# Patient Record
Sex: Female | Born: 1980 | Race: White | Hispanic: No | Marital: Single | Smoking: Current every day smoker
Health system: Southern US, Community
[De-identification: ages and names within clinical notes are randomized; demographics above are authoritative.]

## PROBLEM LIST (undated history)

## (undated) DIAGNOSIS — R51 Headache: Principal | ICD-10-CM

## (undated) DIAGNOSIS — R569 Unspecified convulsions: Secondary | ICD-10-CM

## (undated) DIAGNOSIS — R519 Headache, unspecified: Secondary | ICD-10-CM

## (undated) HISTORY — DX: Unspecified convulsions: R56.9

## (undated) HISTORY — PX: GALLBLADDER SURGERY: SHX652

## (undated) HISTORY — PX: APPENDECTOMY: SHX54

## (undated) HISTORY — DX: Headache: R51

## (undated) HISTORY — PX: CYST EXCISION: SHX5701

## (undated) HISTORY — PX: PARTIAL HYSTERECTOMY: SHX80

## (undated) HISTORY — DX: Headache, unspecified: R51.9

---

## 2004-05-13 ENCOUNTER — Other Ambulatory Visit: Payer: Self-pay

## 2004-08-27 ENCOUNTER — Emergency Department: Payer: Self-pay | Admitting: Emergency Medicine

## 2004-09-19 ENCOUNTER — Emergency Department: Payer: Self-pay | Admitting: Internal Medicine

## 2004-09-30 ENCOUNTER — Emergency Department: Payer: Self-pay | Admitting: Emergency Medicine

## 2004-10-03 ENCOUNTER — Ambulatory Visit: Payer: Self-pay | Admitting: Obstetrics & Gynecology

## 2004-11-16 ENCOUNTER — Emergency Department: Payer: Self-pay | Admitting: Emergency Medicine

## 2005-04-19 ENCOUNTER — Emergency Department: Payer: Self-pay | Admitting: Emergency Medicine

## 2005-05-13 ENCOUNTER — Ambulatory Visit: Payer: Self-pay

## 2005-06-08 ENCOUNTER — Emergency Department: Payer: Self-pay | Admitting: Emergency Medicine

## 2005-10-10 ENCOUNTER — Emergency Department: Payer: Self-pay | Admitting: Emergency Medicine

## 2006-08-22 ENCOUNTER — Emergency Department: Payer: Self-pay | Admitting: Emergency Medicine

## 2006-09-02 ENCOUNTER — Emergency Department: Payer: Self-pay

## 2006-09-28 ENCOUNTER — Emergency Department: Payer: Self-pay | Admitting: Emergency Medicine

## 2006-10-15 ENCOUNTER — Emergency Department: Payer: Self-pay | Admitting: Emergency Medicine

## 2006-11-04 ENCOUNTER — Ambulatory Visit: Payer: Self-pay | Admitting: Gastroenterology

## 2007-04-24 ENCOUNTER — Emergency Department: Payer: Self-pay | Admitting: Emergency Medicine

## 2007-04-26 ENCOUNTER — Emergency Department: Payer: Self-pay | Admitting: Emergency Medicine

## 2007-04-28 ENCOUNTER — Emergency Department (HOSPITAL_COMMUNITY): Admission: EM | Admit: 2007-04-28 | Discharge: 2007-04-28 | Payer: Self-pay | Admitting: Emergency Medicine

## 2007-05-09 ENCOUNTER — Emergency Department: Payer: Self-pay

## 2007-05-31 ENCOUNTER — Emergency Department (HOSPITAL_COMMUNITY): Admission: EM | Admit: 2007-05-31 | Discharge: 2007-05-31 | Payer: Self-pay | Admitting: Emergency Medicine

## 2007-07-19 ENCOUNTER — Emergency Department: Payer: Self-pay | Admitting: Emergency Medicine

## 2007-08-08 ENCOUNTER — Emergency Department: Payer: Self-pay | Admitting: Internal Medicine

## 2007-08-27 ENCOUNTER — Other Ambulatory Visit: Payer: Self-pay

## 2007-08-27 ENCOUNTER — Emergency Department: Payer: Self-pay | Admitting: Emergency Medicine

## 2007-09-26 ENCOUNTER — Emergency Department: Payer: Self-pay | Admitting: Internal Medicine

## 2007-09-29 ENCOUNTER — Inpatient Hospital Stay: Payer: Self-pay | Admitting: Internal Medicine

## 2007-10-22 ENCOUNTER — Emergency Department: Payer: Self-pay | Admitting: Emergency Medicine

## 2007-10-22 ENCOUNTER — Other Ambulatory Visit: Payer: Self-pay

## 2007-10-31 ENCOUNTER — Inpatient Hospital Stay: Payer: Self-pay | Admitting: Internal Medicine

## 2007-11-23 ENCOUNTER — Ambulatory Visit: Payer: Self-pay | Admitting: Ophthalmology

## 2007-11-29 ENCOUNTER — Emergency Department: Payer: Self-pay | Admitting: Emergency Medicine

## 2007-11-30 ENCOUNTER — Emergency Department: Payer: Self-pay | Admitting: Emergency Medicine

## 2007-12-17 ENCOUNTER — Ambulatory Visit: Payer: Self-pay | Admitting: Obstetrics and Gynecology

## 2007-12-23 ENCOUNTER — Emergency Department: Payer: Self-pay | Admitting: Emergency Medicine

## 2007-12-24 ENCOUNTER — Inpatient Hospital Stay: Payer: Self-pay | Admitting: Internal Medicine

## 2008-02-18 ENCOUNTER — Emergency Department: Payer: Self-pay | Admitting: Emergency Medicine

## 2008-02-29 ENCOUNTER — Emergency Department: Payer: Self-pay | Admitting: Emergency Medicine

## 2008-04-12 ENCOUNTER — Ambulatory Visit: Payer: Self-pay | Admitting: Family Medicine

## 2008-04-12 ENCOUNTER — Emergency Department: Payer: Self-pay | Admitting: Emergency Medicine

## 2008-04-12 ENCOUNTER — Encounter: Payer: Self-pay | Admitting: Family Medicine

## 2008-04-13 ENCOUNTER — Emergency Department: Payer: Self-pay | Admitting: Emergency Medicine

## 2008-04-17 ENCOUNTER — Ambulatory Visit: Payer: Self-pay | Admitting: Family Medicine

## 2008-04-20 ENCOUNTER — Emergency Department: Payer: Self-pay | Admitting: Emergency Medicine

## 2008-04-22 ENCOUNTER — Emergency Department: Payer: Self-pay | Admitting: Emergency Medicine

## 2008-06-17 ENCOUNTER — Emergency Department: Payer: Self-pay | Admitting: Emergency Medicine

## 2008-06-29 ENCOUNTER — Inpatient Hospital Stay: Payer: Self-pay | Admitting: Internal Medicine

## 2008-08-09 ENCOUNTER — Observation Stay: Payer: Self-pay | Admitting: Internal Medicine

## 2009-01-12 ENCOUNTER — Emergency Department: Payer: Self-pay | Admitting: Emergency Medicine

## 2009-02-18 ENCOUNTER — Emergency Department: Payer: Self-pay | Admitting: Emergency Medicine

## 2009-02-19 ENCOUNTER — Emergency Department: Payer: Self-pay | Admitting: Emergency Medicine

## 2009-02-25 ENCOUNTER — Emergency Department: Payer: Self-pay | Admitting: Unknown Physician Specialty

## 2009-03-28 ENCOUNTER — Emergency Department: Payer: Self-pay | Admitting: Emergency Medicine

## 2009-04-29 ENCOUNTER — Emergency Department: Payer: Self-pay | Admitting: Unknown Physician Specialty

## 2009-04-30 ENCOUNTER — Emergency Department: Payer: Self-pay | Admitting: Emergency Medicine

## 2009-06-30 ENCOUNTER — Emergency Department: Payer: Self-pay | Admitting: Internal Medicine

## 2009-07-09 ENCOUNTER — Emergency Department: Payer: Self-pay | Admitting: Emergency Medicine

## 2009-11-16 ENCOUNTER — Emergency Department: Payer: Self-pay | Admitting: Unknown Physician Specialty

## 2010-03-02 ENCOUNTER — Emergency Department: Payer: Self-pay | Admitting: Unknown Physician Specialty

## 2010-06-26 ENCOUNTER — Emergency Department: Payer: Self-pay | Admitting: Emergency Medicine

## 2010-08-05 ENCOUNTER — Emergency Department: Payer: Self-pay | Admitting: Emergency Medicine

## 2010-08-24 ENCOUNTER — Emergency Department: Payer: Self-pay | Admitting: Emergency Medicine

## 2010-08-30 ENCOUNTER — Emergency Department: Payer: Self-pay | Admitting: Emergency Medicine

## 2010-09-10 ENCOUNTER — Emergency Department: Payer: Self-pay | Admitting: Emergency Medicine

## 2011-04-01 NOTE — Assessment & Plan Note (Signed)
Lindsay Ho, CASEBEER NO.:  [id number]   MEDICAL RECORD NO.:  [id number]          PATIENT TYPE:  POB   LOCATION:  [facility] at [facility]         FACILITY:  [facility]   PHYSICIAN:  Tinnie Gens, MD        DATE OF BIRTH:  1981/08/06   DATE OF SERVICE:  04/17/2008                                  CLINIC NOTE   CHIEF COMPLAINT:  Follow up results.   HISTORY OF PRESENT ILLNESS:  The patient is a 30 year old gravida 5,  para 2, who was just seen on Apr 12, 2008, for chronic pelvic pain, who  at that time, told us that she had previously been seen at [facility], and that she had been taking Tylenol and Vicodin for 1-  year history of right-sided pelvic pain and bloating.  The patient did  ask for pain medicine as she was leaving the door and does have history  to ibuprofen.  The patient initially left with a plan for pelvic  sonography and then followup in 2 weeks.  After seeing [place], her  ultrasound was scheduled for Apr 14, 2008, but she failed to keep that  appointment.  The patient then went to [facility] ED on Apr 12, 2008, after leaving here and stated that her Vicodin was not working.  She says that she did not stay in that ED because of a 10-hour wait.  The patient then goes to [facility] yesterday and has pelvic  sonogram, which basically shows a normal pelvis including a 2-cm  probably follicle on the right ovary.  At that time, she was complaining  of left lower quadrant pain.  She tells me that she has an OB physician  at an outside clinic, but that she could not get scheduled for an  ultrasound for several weeks and that was the last she came to their  establishment.  When I confronted her on this issue, she states that she  told them it will be several weeks before I could get her back for  results, which she was scheduled back in 2 weeks for results, but she  did not have an ultrasound scheduled for the week prior for actually 2  days after her visit with [place].  The patient was given IV Dilaudid when  she was in the ED there, as well as some p.o. Phenergan and was sent  home.  The patient was confronted about drug-seeking behavior, which she  completely denied and stated that she did not want Vicodin, she did not  want anything for pain.  She just used Tylenol, would it help [place] if she  brought back in her Vicodin.  I had told her yes, she states she has  only taken 4 out of the pack, and then she will bring them in tomorrow.  Additionally, we called the [facility] to get her records  from there.  It turns out she has been on quite a lot of narcotics  including a fentanyl patch 35 mcg every third day, Xanax, Percocet,  Ambien, Lunesta, Tussionex, and so it is unclear to me why she has so  much chronic pain and why she is on so many drugs, but I did offer her  surgery to include an endometrial ablation.  I did tell her that this  would mean absolutely no more kids even though she is status post tubal.  She immediately stated she was not clear if she wanted the surgery done.  Additionally, I offered her a laparoscopy to see if she had some  possible scar tissue that was leading to her chronic pelvic pain.  Did  discuss risks of this and this would be her fifth laparoscopy, and the  potential for injury to bowel, and the patient quickly said she was  unsure if she wanted this done either.  Since leaving the office today,  we have several pharmacies who faxed in prescriptions for Xanax,  Vicodin, Ambien, and Percocet in the past several months.  Different  physicians have written her for different ones of these, and she has  gone to at least 2 different drug systems about the fentanyl patch, and  I think she has been getting or not almost __________  she has several  other drug sources that she is using.  The patient seemed very well  equipped with knowledge about drug-seeking behavior and the problems  with  narcotics in this day and age including the fact that her previous  physician had may be even suspended for trying too much in the way of  narcotics for patients.  So, she is supposed to bring back her Vicodin  tomorrow and call us back when she is ready to talk about scheduling  surgery.  I do not think it would be appropriate to give her any more  narcotics at this time, given how much she has been on previously,  although she started to __________ ignorance when I  discussed as this  might be an issue for her.           ______________________________  Tinnie Gens, MD     TP/MEDQ  D:  04/17/2008  T:  04/18/2008  Job:  [id number]

## 2011-04-01 NOTE — Assessment & Plan Note (Signed)
NAMEKIRBIE, STODGHILL NO.:  [id number]   MEDICAL RECORD NO.:  [id number]          PATIENT TYPE:  POB   LOCATION:  [facility] at [facility]         FACILITY:  [facility]   PHYSICIAN:  Tinnie Gens, MD        DATE OF BIRTH:  Sep 07, 1981   DATE OF SERVICE:  04/12/2008                                  CLINIC NOTE   CHIEF COMPLAINT:  Pelvic pain.   HISTORY OF PRESENT ILLNESS:  The patient is a 30 year old gravida 5,  para 2, who has a history of right ovarian cyst with ovarian cystectomy  at [facility] as well as a history of tubal ligation in  the past in 2008.  She has reported pelvic pain for some time as well as  depression.  She is on Cymbalta and Seroquel for that.  She reports 1-  year history of pelvic pain especially on the right side associated with  some bloating of the abdomen.  She reports the pain radiates into her  legs and is sharp in nature.  She takes Tylenol and Vicodin for pain,  which usually relieves it.  She travels a lot.  Her husband is in [place], and so, she has gotten care in multiple places, but they are  back here to stay.  She reports she has not seen a doctor for  approximately 3 months, but most recently saw [facility],  who did do a TSH approximately 2-3 months ago.  The patient also reports  cycles that are every 60-90 days and have a lot of heavy flow related to  them.  The patient previously had an IUD.  She thought that her  abdominal pain got worse with it, so she had it removed and then had  tubal ligation.  However, she reports that her pelvic pain is actually  worse since her tubal ligation.  She has not had a pelvic sonogram  recently.   PAST MEDICAL HISTORY:  Negative.   PAST SURGICAL HISTORY:  1. Cholecystectomy in 2007.  2. Appendectomy in 1999.  3. Tubal ligation in 2008.  4. Ovarian cyst removal at [facility] in 2005, that was on the      right side.   MEDICATIONS:  1. Cymbalta 1  p.o. daily.  2. Seroquel 1 p.o. nightly.  3. Ambien 10 mg 1 p.o. nightly.   ALLERGIES:  IBUPROFEN causes itching and throwing up.  She reports that  she never takes aspirin, and that she can take Aleve but it does not  really work for her.   OB HISTORY:  She is G5, P2 with 2 vaginal deliveries.  She had 2  miscarriages and 1 termination.  Her children are aged 34 and 41, both  girls.   GYN HISTORY:  Menarche at age 71.  Cycles every 30-60 days.  Heavy flow.  Severe pain.  They last for approximately 7 days.  She has had tubal  ligation for birth control and no history of abnormal Pap.  No history  of STDs.   FAMILY HISTORY:  Significant for coronary artery disease, hypertension,  and cancer.   SOCIAL HISTORY:  She is a smoker, one-half pack per day for  approximately 10 years.  No other drugs or alcohol use.  She denies  sexual abuse.   REVIEW OF SYSTEMS:  A 14-point review of systems is reviewed.  Please  see GYN history on the chart.  She reports some fatigue all the time.  She thinks this is related to some mild anemia.  She has some dizzy  spells that are also associated with this.  She reports nausea  associated with abdominal pain and anorexia.  At that time, she does not  feel like eating, but no exceptional vomiting.  She reports hot flashes  as well.   PHYSICAL EXAMINATION:  Her vitals are as noted in the chart.  She is a  well-developed, well-nourished female in no acute distress.  ABDOMEN:  Soft, nontender, nondistended.  GU:  Normal external female genitalia.  BUS is normal.  Vagina is pink  and rugated.  Cervix is parous without lesion.  The uterus is small,  anteverted, exquisitely tender.  No left adnexal tenderness.  In the  right adnexa, there is some fullness on that side, question separate  from the ovary and it is exquisitely tender as well.   IMPRESSION:  1. Pelvic pain.  2. Gynecologic exam with Pap.   PLAN:  1. Pap smear today.  2. Pelvic  sonogram.  3. Vicodin for pain.  The patient asked for this on leaving the room.  4. Obtain records from the [facility].  5. Discussed with the patient what her options are except for maybe      endometrial ablation plus or minus right ovary removal.  She kind      of would like both ovaries removed.  However, we discussed that      this is not an option as she will become menopausal.  Results will      be reviewed in 2 weeks.           ______________________________  Tinnie Gens, MD     TP/MEDQ  D:  04/12/2008  T:  04/13/2008  Job:  [id number]

## 2011-04-21 ENCOUNTER — Emergency Department: Payer: Self-pay | Admitting: Emergency Medicine

## 2011-04-25 ENCOUNTER — Emergency Department: Payer: Self-pay | Admitting: Emergency Medicine

## 2011-07-08 ENCOUNTER — Inpatient Hospital Stay: Payer: Self-pay | Admitting: Internal Medicine

## 2011-09-02 LAB — DIFFERENTIAL
Basophils Absolute: 0.1
Basophils Relative: 1
Eosinophils Absolute: 0
Eosinophils Relative: 0
Lymphocytes Relative: 17
Lymphs Abs: 2
Monocytes Absolute: 0.8 — ABNORMAL HIGH
Monocytes Relative: 7
Neutro Abs: 8.9 — ABNORMAL HIGH
Neutrophils Relative %: 75

## 2011-09-02 LAB — CBC
HCT: 32.4 — ABNORMAL LOW
Hemoglobin: 10.7 — ABNORMAL LOW
MCHC: 32.9
MCV: 73.7 — ABNORMAL LOW
Platelets: 324
RBC: 4.4
RDW: 16.8 — ABNORMAL HIGH
WBC: 11.8 — ABNORMAL HIGH

## 2011-09-02 LAB — URINALYSIS, ROUTINE W REFLEX MICROSCOPIC
Protein, ur: NEGATIVE
Specific Gravity, Urine: 1.016
Urobilinogen, UA: 0.2

## 2011-09-02 LAB — I-STAT 8, (EC8 V) (CONVERTED LAB)
Acid-base deficit: 1
Chloride: 108
HCT: 35 — ABNORMAL LOW
Hemoglobin: 11.9 — ABNORMAL LOW
Operator id: 151321
Potassium: 4.1
Sodium: 138
pCO2, Ven: 30.8 — ABNORMAL LOW

## 2011-09-02 LAB — POCT PREGNANCY, URINE: Operator id: 151321

## 2011-09-02 LAB — WET PREP, GENITAL: Clue Cells Wet Prep HPF POC: NONE SEEN

## 2011-09-02 LAB — RPR: RPR Ser Ql: NONREACTIVE

## 2011-09-02 LAB — URINE MICROSCOPIC-ADD ON

## 2011-09-02 LAB — URINE CULTURE: Colony Count: NO GROWTH

## 2011-09-02 LAB — POCT I-STAT CREATININE: Operator id: 151321

## 2011-10-17 ENCOUNTER — Emergency Department: Payer: Self-pay | Admitting: *Deleted

## 2012-02-02 ENCOUNTER — Emergency Department: Payer: Self-pay | Admitting: Emergency Medicine

## 2012-03-18 LAB — TROPONIN I: Troponin-I: <0.02 ng/mL

## 2012-03-18 LAB — COMPREHENSIVE METABOLIC PANEL
Albumin: 4.3 g/dL (ref 3.4–5.0)
Alkaline Phosphatase: 63 U/L (ref 50–136)
Bilirubin,Total: 0.4 mg/dL (ref 0.2–1.0)
Chloride: 107 mmol/L (ref 98–107)
Co2: 28 mmol/L (ref 21–32)
EGFR (Non-African Amer.): >60
Glucose: 68 mg/dL (ref 65–99)
Osmolality: 277 (ref 275–301)
Potassium: 3.6 mmol/L (ref 3.5–5.1)
SGPT (ALT): 13 U/L
Sodium: 140 mmol/L (ref 136–145)

## 2012-03-18 LAB — CBC
HCT: 42 % (ref 35.0–47.0)
MCH: 29.2 pg (ref 26.0–34.0)
MCHC: 34.3 g/dL (ref 32.0–36.0)
Platelet: 242 10*3/uL (ref 150–440)
RBC: 4.94 10*6/uL (ref 3.80–5.20)

## 2012-03-19 ENCOUNTER — Inpatient Hospital Stay: Payer: Self-pay | Admitting: Internal Medicine

## 2012-03-20 LAB — BASIC METABOLIC PANEL
Anion Gap: 8 (ref 7–16)
BUN: 16 mg/dL (ref 7–18)
Chloride: 105 mmol/L (ref 98–107)
Co2: 23 mmol/L (ref 21–32)
Creatinine: 0.58 mg/dL — ABNORMAL LOW (ref 0.60–1.30)
EGFR (African American): >60
EGFR (Non-African Amer.): >60
Osmolality: 273 (ref 275–301)
Potassium: 3.8 mmol/L (ref 3.5–5.1)
Sodium: 136 mmol/L (ref 136–145)

## 2012-03-20 LAB — CBC WITH DIFFERENTIAL/PLATELET
Basophil #: 0.1 10*3/uL (ref 0.0–0.1)
Basophil %: 0.8 %
Eosinophil #: 0.3 10*3/uL (ref 0.0–0.7)
Lymphocyte %: 33 %
MCV: 86 fL (ref 80–100)
Monocyte #: 0.9 x10 3/mm (ref 0.2–0.9)
Monocyte %: 7.6 %
Neutrophil #: 6.3 10*3/uL (ref 1.4–6.5)
Platelet: 222 10*3/uL (ref 150–440)
RDW: 14.1 % (ref 11.5–14.5)

## 2012-08-11 ENCOUNTER — Ambulatory Visit: Payer: Self-pay | Admitting: Obstetrics & Gynecology

## 2012-08-13 ENCOUNTER — Ambulatory Visit: Payer: Self-pay | Admitting: Obstetrics & Gynecology

## 2012-12-18 ENCOUNTER — Emergency Department: Payer: Self-pay | Admitting: Emergency Medicine

## 2012-12-18 LAB — COMPREHENSIVE METABOLIC PANEL
Albumin: 4.5 g/dL (ref 3.4–5.0)
Alkaline Phosphatase: 82 U/L (ref 50–136)
BUN: 14 mg/dL (ref 7–18)
Bilirubin,Total: 0.3 mg/dL (ref 0.2–1.0)
Chloride: 105 mmol/L (ref 98–107)
Co2: 28 mmol/L (ref 21–32)
Creatinine: 0.76 mg/dL (ref 0.60–1.30)
EGFR (African American): >60
Glucose: 109 mg/dL — ABNORMAL HIGH (ref 65–99)
Potassium: 3.8 mmol/L (ref 3.5–5.1)
SGOT(AST): 24 U/L (ref 15–37)
SGPT (ALT): 29 U/L (ref 12–78)
Sodium: 138 mmol/L (ref 136–145)
Total Protein: 8.1 g/dL (ref 6.4–8.2)

## 2012-12-18 LAB — CBC
HCT: 41.2 % (ref 35.0–47.0)
HGB: 13.7 g/dL (ref 12.0–16.0)
MCH: 28.9 pg (ref 26.0–34.0)
MCHC: 33.4 g/dL (ref 32.0–36.0)
MCV: 87 fL (ref 80–100)
Platelet: 242 10*3/uL (ref 150–440)
RBC: 4.76 10*6/uL (ref 3.80–5.20)
RDW: 13 % (ref 11.5–14.5)
WBC: 14 10*3/uL — ABNORMAL HIGH (ref 3.6–11.0)

## 2012-12-18 LAB — TROPONIN I: Troponin-I: <0.02 ng/mL

## 2013-03-14 ENCOUNTER — Emergency Department: Payer: Self-pay | Admitting: Emergency Medicine

## 2013-03-14 LAB — CBC
HCT: 40.2 % (ref 35.0–47.0)
HGB: 13.7 g/dL (ref 12.0–16.0)
MCH: 28.7 pg (ref 26.0–34.0)
MCHC: 34.1 g/dL (ref 32.0–36.0)
RBC: 4.77 10*6/uL (ref 3.80–5.20)
WBC: 15 10*3/uL — ABNORMAL HIGH (ref 3.6–11.0)

## 2013-03-14 LAB — LIPASE, BLOOD: Lipase: 265 U/L (ref 73–393)

## 2013-03-14 LAB — URINALYSIS, COMPLETE
Bilirubin,UR: NEGATIVE
Blood: NEGATIVE
Glucose,UR: NEGATIVE mg/dL (ref 0–75)
Ketone: NEGATIVE
Nitrite: NEGATIVE
Ph: 6 (ref 4.5–8.0)
Protein: NEGATIVE
RBC,UR: 1 /HPF (ref 0–5)
Squamous Epithelial: 14
WBC UR: 2 /HPF (ref 0–5)

## 2013-03-14 LAB — COMPREHENSIVE METABOLIC PANEL
Alkaline Phosphatase: 52 U/L (ref 50–136)
BUN: 21 mg/dL — ABNORMAL HIGH (ref 7–18)
Bilirubin,Total: 0.2 mg/dL (ref 0.2–1.0)
Calcium, Total: 8.4 mg/dL — ABNORMAL LOW (ref 8.5–10.1)
Co2: 25 mmol/L (ref 21–32)
Creatinine: 0.64 mg/dL (ref 0.60–1.30)
Potassium: 3.5 mmol/L (ref 3.5–5.1)
SGOT(AST): 13 U/L — ABNORMAL LOW (ref 15–37)
SGPT (ALT): 11 U/L — ABNORMAL LOW (ref 12–78)
Total Protein: 7.5 g/dL (ref 6.4–8.2)

## 2013-03-14 LAB — PROTIME-INR: INR: 0.9

## 2013-07-04 ENCOUNTER — Emergency Department: Payer: Self-pay | Admitting: Emergency Medicine

## 2013-07-25 ENCOUNTER — Inpatient Hospital Stay: Payer: Self-pay | Admitting: Internal Medicine

## 2013-07-25 LAB — URINALYSIS, COMPLETE
Glucose,UR: NEGATIVE mg/dL (ref 0–75)
Ph: 6 (ref 4.5–8.0)
RBC,UR: <1 /HPF (ref 0–5)
Specific Gravity: 1.014 (ref 1.003–1.030)
Squamous Epithelial: 8

## 2013-07-25 LAB — CBC WITH DIFFERENTIAL/PLATELET
Basophil #: 0 10*3/uL (ref 0.0–0.1)
Basophil %: 0.7 %
HCT: 16 % — ABNORMAL LOW (ref 35.0–47.0)
Lymphocyte #: 2.1 10*3/uL (ref 1.0–3.6)
Monocyte #: 0.6 x10 3/mm (ref 0.2–0.9)
Neutrophil #: 4.4 10*3/uL (ref 1.4–6.5)
Platelet: 388 10*3/uL (ref 150–440)
RBC: 2.04 10*6/uL — ABNORMAL LOW (ref 3.80–5.20)
RDW: 18.1 % — ABNORMAL HIGH (ref 11.5–14.5)
WBC: 7.4 10*3/uL (ref 3.6–11.0)

## 2013-07-25 LAB — APTT: Activated PTT: 30.3 secs (ref 23.6–35.9)

## 2013-07-25 LAB — COMPREHENSIVE METABOLIC PANEL
Albumin: 3.5 g/dL (ref 3.4–5.0)
Alkaline Phosphatase: 59 U/L (ref 50–136)
Anion Gap: 4 — ABNORMAL LOW (ref 7–16)
BUN: 11 mg/dL (ref 7–18)
Bilirubin,Total: 0.2 mg/dL (ref 0.2–1.0)
Co2: 27 mmol/L (ref 21–32)
Creatinine: 0.62 mg/dL (ref 0.60–1.30)
Glucose: 89 mg/dL (ref 65–99)
Osmolality: 273 (ref 275–301)
SGOT(AST): 11 U/L — ABNORMAL LOW (ref 15–37)
SGPT (ALT): 14 U/L (ref 12–78)

## 2013-07-25 LAB — IRON AND TIBC
Iron Saturation: 3 %
Iron: 14 ug/dL — ABNORMAL LOW (ref 50–170)

## 2013-07-25 LAB — PROTIME-INR: INR: 1

## 2013-07-26 LAB — BASIC METABOLIC PANEL
Calcium, Total: 7.4 mg/dL — ABNORMAL LOW (ref 8.5–10.1)
Co2: 24 mmol/L (ref 21–32)
EGFR (Non-African Amer.): >60
Glucose: 80 mg/dL (ref 65–99)
Osmolality: 277 (ref 275–301)
Potassium: 3.9 mmol/L (ref 3.5–5.1)
Sodium: 141 mmol/L (ref 136–145)

## 2013-07-26 LAB — CBC WITH DIFFERENTIAL/PLATELET
Basophil #: 0.1 10*3/uL (ref 0.0–0.1)
Eosinophil #: 0.2 10*3/uL (ref 0.0–0.7)
HCT: 23.8 % — ABNORMAL LOW (ref 35.0–47.0)
HGB: 8.1 g/dL — ABNORMAL LOW (ref 12.0–16.0)
Lymphocyte #: 2.8 10*3/uL (ref 1.0–3.6)
Lymphocyte %: 37.3 %
MCV: 79 fL — ABNORMAL LOW (ref 80–100)
Monocyte #: 0.4 x10 3/mm (ref 0.2–0.9)
Neutrophil #: 4 10*3/uL (ref 1.4–6.5)
Neutrophil %: 53.9 %
Platelet: 316 10*3/uL (ref 150–440)
RBC: 2.99 10*6/uL — ABNORMAL LOW (ref 3.80–5.20)
RDW: 16.2 % — ABNORMAL HIGH (ref 11.5–14.5)

## 2013-07-27 LAB — PATHOLOGY REPORT

## 2013-07-27 LAB — HEMOGLOBIN: HGB: 8.6 g/dL — ABNORMAL LOW (ref 12.0–16.0)

## 2013-07-28 LAB — HEMOGLOBIN: HGB: 8.5 g/dL — ABNORMAL LOW (ref 12.0–16.0)

## 2013-07-29 LAB — BASIC METABOLIC PANEL
Anion Gap: 4 — ABNORMAL LOW (ref 7–16)
BUN: 11 mg/dL (ref 7–18)
Calcium, Total: 8.7 mg/dL (ref 8.5–10.1)
Chloride: 105 mmol/L (ref 98–107)
Creatinine: 0.59 mg/dL — ABNORMAL LOW (ref 0.60–1.30)
EGFR (African American): >60
Osmolality: 271 (ref 275–301)
Potassium: 4.3 mmol/L (ref 3.5–5.1)

## 2013-07-30 LAB — PREGNANCY, URINE: Pregnancy Test, Urine: NEGATIVE m[IU]/mL

## 2013-08-09 ENCOUNTER — Other Ambulatory Visit: Payer: Self-pay | Admitting: Gastroenterology

## 2013-08-09 ENCOUNTER — Ambulatory Visit: Payer: Self-pay | Admitting: Gastroenterology

## 2013-08-09 LAB — CBC WITH DIFFERENTIAL/PLATELET
Basophil %: 0.9 %
Eosinophil %: 2 %
Lymphocyte %: 30.1 %
MCH: 25.4 pg — ABNORMAL LOW (ref 26.0–34.0)
MCV: 78 fL — ABNORMAL LOW (ref 80–100)
Monocyte #: 0.7 x10 3/mm (ref 0.2–0.9)
Neutrophil #: 6.2 10*3/uL (ref 1.4–6.5)
Neutrophil %: 60.6 %
Platelet: 274 10*3/uL (ref 150–440)
RBC: 4.07 10*6/uL (ref 3.80–5.20)
RDW: 19.3 % — ABNORMAL HIGH (ref 11.5–14.5)

## 2013-08-13 ENCOUNTER — Emergency Department: Payer: Self-pay | Admitting: Emergency Medicine

## 2013-08-13 LAB — CBC WITH DIFFERENTIAL/PLATELET
Eosinophil %: 0.5 %
HCT: 28.2 % — ABNORMAL LOW (ref 35.0–47.0)
Lymphocyte #: 2.3 10*3/uL (ref 1.0–3.6)
Lymphocyte %: 28.3 %
MCH: 25.2 pg — ABNORMAL LOW (ref 26.0–34.0)
MCHC: 33 g/dL (ref 32.0–36.0)
Monocyte %: 6.3 %
Neutrophil %: 63.6 %
Platelet: 288 10*3/uL (ref 150–440)
WBC: 8.1 10*3/uL (ref 3.6–11.0)

## 2013-08-13 LAB — URINALYSIS, COMPLETE
Bacteria: NONE SEEN
Bilirubin,UR: NEGATIVE
Blood: NEGATIVE
Leukocyte Esterase: NEGATIVE
Nitrite: NEGATIVE
Ph: 6 (ref 4.5–8.0)
RBC,UR: <1 /HPF (ref 0–5)
Squamous Epithelial: 1

## 2013-08-13 LAB — BASIC METABOLIC PANEL
Anion Gap: 5 — ABNORMAL LOW (ref 7–16)
BUN: 9 mg/dL (ref 7–18)
Chloride: 108 mmol/L — ABNORMAL HIGH (ref 98–107)
Co2: 26 mmol/L (ref 21–32)
Creatinine: 0.71 mg/dL (ref 0.60–1.30)
EGFR (Non-African Amer.): >60
Potassium: 3.5 mmol/L (ref 3.5–5.1)

## 2013-08-13 LAB — PREGNANCY, URINE: Pregnancy Test, Urine: NEGATIVE m[IU]/mL

## 2013-08-16 ENCOUNTER — Ambulatory Visit: Payer: Self-pay | Admitting: Oncology

## 2013-08-16 LAB — RETICULOCYTES
Absolute Retic Count: 0.0688 10*6/uL (ref 0.019–0.186)
Reticulocyte: 1.79 % (ref 0.4–3.1)

## 2013-08-16 LAB — CBC CANCER CENTER
Basophil #: 0.1 x10 3/mm (ref 0.0–0.1)
Eosinophil #: 0.1 x10 3/mm (ref 0.0–0.7)
HGB: 9.8 g/dL — ABNORMAL LOW (ref 12.0–16.0)
Lymphocyte %: 28.5 %
MCV: 77 fL — ABNORMAL LOW (ref 80–100)
Monocyte #: 0.5 x10 3/mm (ref 0.2–0.9)
Monocyte %: 6.3 %
Neutrophil #: 5 x10 3/mm (ref 1.4–6.5)
Platelet: 363 x10 3/mm (ref 150–440)
RBC: 3.85 10*6/uL (ref 3.80–5.20)
RDW: 19 % — ABNORMAL HIGH (ref 11.5–14.5)

## 2013-08-16 LAB — IRON AND TIBC
Iron: 14 ug/dL — ABNORMAL LOW (ref 50–170)
Unbound Iron-Bind.Cap.: 414 ug/dL

## 2013-08-16 LAB — FERRITIN: Ferritin (ARMC): 3 ng/mL — ABNORMAL LOW (ref 8–388)

## 2013-08-17 ENCOUNTER — Ambulatory Visit: Payer: Self-pay | Admitting: Oncology

## 2013-08-30 LAB — CBC CANCER CENTER
Eosinophil #: 0.1 x10 3/mm (ref 0.0–0.7)
Eosinophil %: 0.9 %
Lymphocyte #: 2 x10 3/mm (ref 1.0–3.6)
Lymphocyte %: 26.1 %
MCH: 26.7 pg (ref 26.0–34.0)
MCHC: 32.6 g/dL (ref 32.0–36.0)
MCV: 82 fL (ref 80–100)
Monocyte %: 8.6 %
Neutrophil #: 4.9 x10 3/mm (ref 1.4–6.5)
Neutrophil %: 63.4 %
RBC: 4.8 10*6/uL (ref 3.80–5.20)
RDW: 25.1 % — ABNORMAL HIGH (ref 11.5–14.5)

## 2013-09-17 ENCOUNTER — Ambulatory Visit: Payer: Self-pay | Admitting: Oncology

## 2013-10-25 ENCOUNTER — Ambulatory Visit: Payer: Self-pay | Admitting: Oncology

## 2013-10-25 LAB — CBC CANCER CENTER
Basophil %: 0.7 %
Eosinophil #: 0.1 x10 3/mm (ref 0.0–0.7)
Eosinophil %: 1.2 %
HGB: 13.2 g/dL (ref 12.0–16.0)
Lymphocyte %: 37 %
MCH: 27.5 pg (ref 26.0–34.0)
MCHC: 32.7 g/dL (ref 32.0–36.0)
MCV: 84 fL (ref 80–100)
Monocyte #: 0.6 x10 3/mm (ref 0.2–0.9)
Monocyte %: 7.5 %
Neutrophil #: 4 x10 3/mm (ref 1.4–6.5)
Platelet: 231 x10 3/mm (ref 150–440)
RBC: 4.8 10*6/uL (ref 3.80–5.20)
WBC: 7.4 x10 3/mm (ref 3.6–11.0)

## 2013-10-25 LAB — IRON AND TIBC
Iron Bind.Cap.(Total): 285 ug/dL (ref 250–450)
Iron: 88 ug/dL (ref 50–170)

## 2013-11-17 ENCOUNTER — Ambulatory Visit: Payer: Self-pay | Admitting: Oncology

## 2013-11-21 ENCOUNTER — Emergency Department: Payer: Self-pay | Admitting: Emergency Medicine

## 2013-11-21 LAB — RAPID INFLUENZA A&B ANTIGENS

## 2014-02-17 IMAGING — CR DG CHEST 1V PORT
1 series · 1 of 1 positions shown · non-contrast
Comparison: none

REASON FOR EXAM: sudden onset of expiratory stridor and dyspnea
COMMENTS:

[ap]
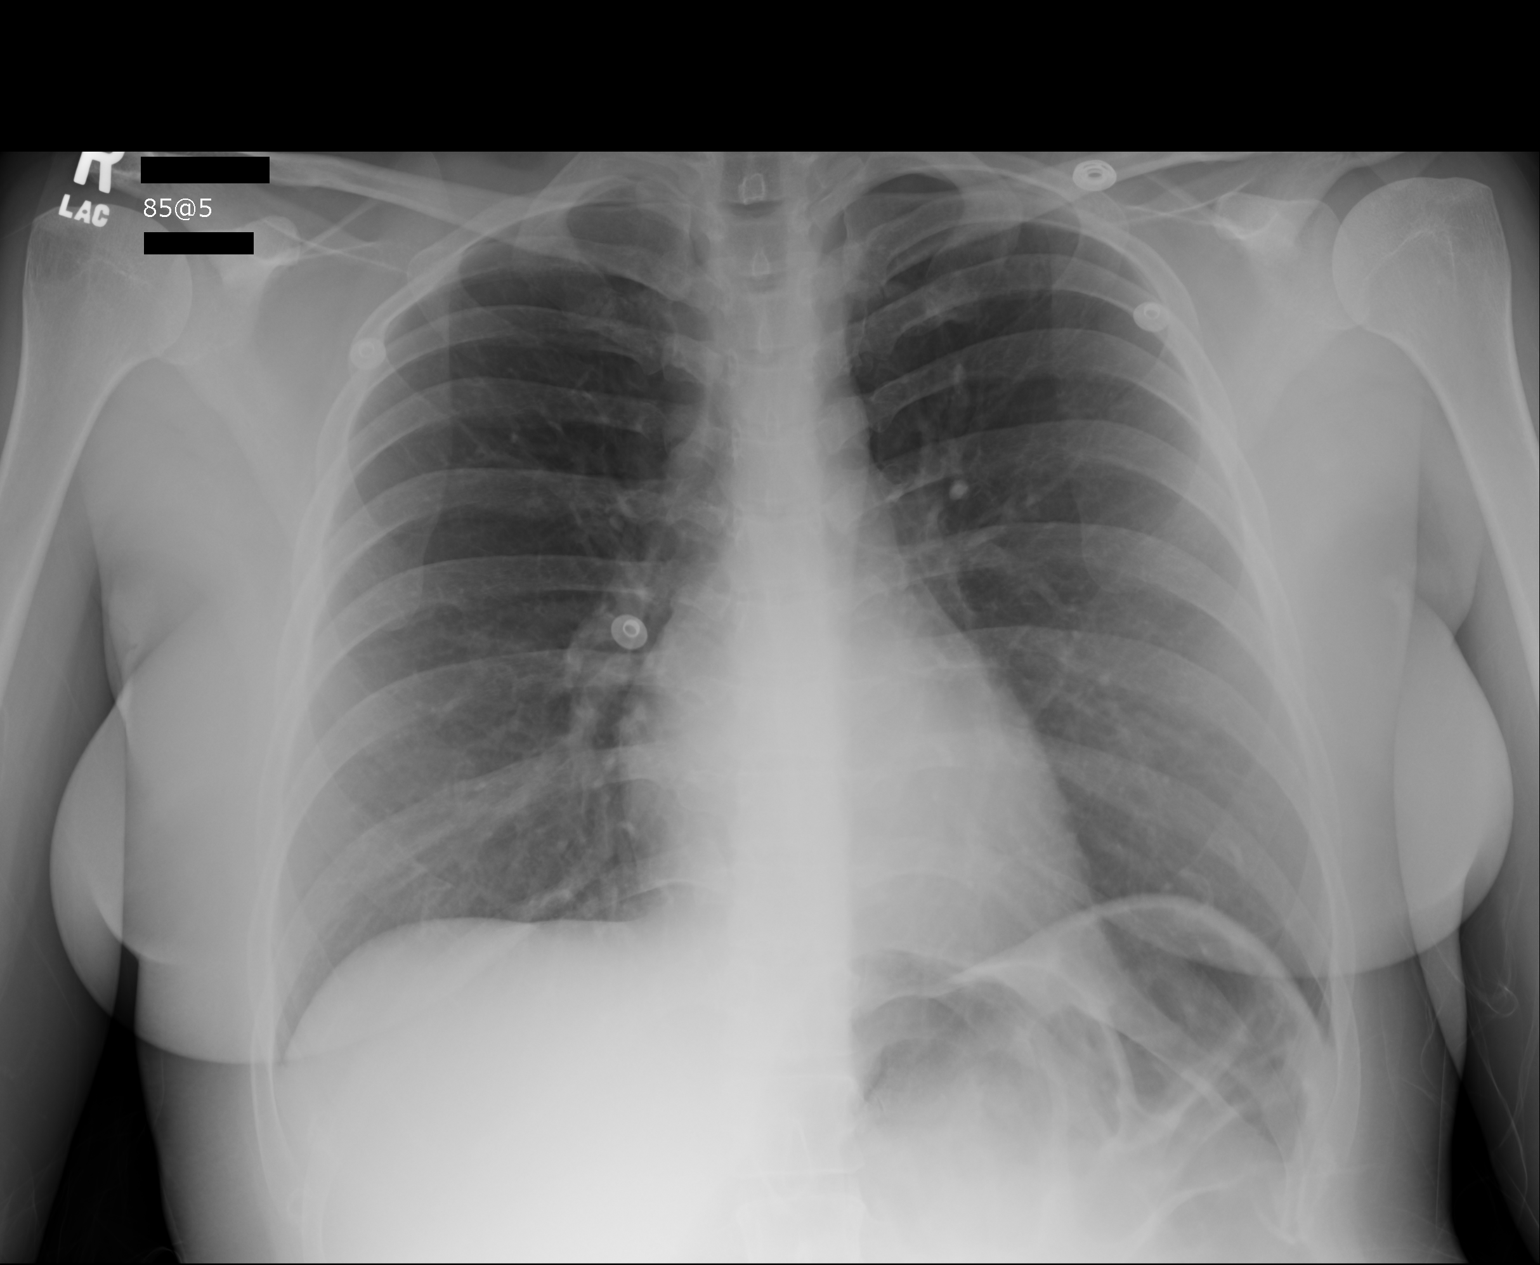

[1 of 1 positions shown; findings below may reference images not displayed]

PROCEDURE:     DXR - DXR PORTABLE CHEST SINGLE VIEW  - July 28, 2013  [DATE]

RESULT:     Comparison is made to the study September 10, 2010.

The lungs are borderline hyperinflated and clear. The cardiac silhouette is
normal in size. The mediastinum is normal in width. The pulmonary
vascularity is not engorged. There is no pleural effusion. The bony thorax
is normal in appearance.
IMPRESSION: There is no evidence of pneumonia nor CHF. Mild
hyperinflation is not new but could reflect underlying reactive airway
disease or acute bronchitis.

[REDACTED]

## 2014-02-20 ENCOUNTER — Emergency Department: Payer: Self-pay | Admitting: Emergency Medicine

## 2014-02-20 LAB — CBC WITH DIFFERENTIAL/PLATELET
BASOS PCT: 0.8 %
Basophil #: 0.1 10*3/uL (ref 0.0–0.1)
EOS PCT: 0.3 %
Eosinophil #: 0 10*3/uL (ref 0.0–0.7)
HCT: 39.5 % (ref 35.0–47.0)
HGB: 12.7 g/dL (ref 12.0–16.0)
LYMPHS ABS: 2.7 10*3/uL (ref 1.0–3.6)
Lymphocyte %: 19.7 %
MCH: 29.1 pg (ref 26.0–34.0)
MCHC: 32.2 g/dL (ref 32.0–36.0)
MCV: 90 fL (ref 80–100)
MONO ABS: 1 x10 3/mm — AB (ref 0.2–0.9)
Monocyte %: 7.3 %
NEUTROS ABS: 9.7 10*3/uL — AB (ref 1.4–6.5)
Neutrophil %: 71.9 %
Platelet: 208 10*3/uL (ref 150–440)
RBC: 4.37 10*6/uL (ref 3.80–5.20)
RDW: 12.9 % (ref 11.5–14.5)
WBC: 13.5 10*3/uL — AB (ref 3.6–11.0)

## 2014-02-20 LAB — BASIC METABOLIC PANEL
Anion Gap: 4 — ABNORMAL LOW (ref 7–16)
BUN: 12 mg/dL (ref 7–18)
Calcium, Total: 8.5 mg/dL (ref 8.5–10.1)
Chloride: 106 mmol/L (ref 98–107)
Co2: 28 mmol/L (ref 21–32)
Creatinine: 0.86 mg/dL (ref 0.60–1.30)
EGFR (Non-African Amer.): >60
Glucose: 80 mg/dL (ref 65–99)
Osmolality: 274 (ref 275–301)
POTASSIUM: 3.7 mmol/L (ref 3.5–5.1)
Sodium: 138 mmol/L (ref 136–145)

## 2014-03-03 ENCOUNTER — Ambulatory Visit: Payer: Self-pay | Admitting: Oncology

## 2014-03-03 LAB — IRON AND TIBC
Iron Bind.Cap.(Total): 374 ug/dL (ref 250–450)
Iron Saturation: 13 %
Iron: 48 ug/dL — ABNORMAL LOW (ref 50–170)
UNBOUND IRON-BIND. CAP.: 326 ug/dL

## 2014-03-03 LAB — CBC CANCER CENTER
BASOS PCT: 1.1 %
Basophil #: 0.1 x10 3/mm (ref 0.0–0.1)
EOS ABS: 0.1 x10 3/mm (ref 0.0–0.7)
Eosinophil %: 1.2 %
HCT: 42.4 % (ref 35.0–47.0)
HGB: 14 g/dL (ref 12.0–16.0)
LYMPHS PCT: 20.8 %
Lymphocyte #: 2.1 x10 3/mm (ref 1.0–3.6)
MCH: 30 pg (ref 26.0–34.0)
MCHC: 32.9 g/dL (ref 32.0–36.0)
MCV: 91 fL (ref 80–100)
Monocyte #: 0.5 x10 3/mm (ref 0.2–0.9)
Monocyte %: 4.6 %
Neutrophil #: 7.4 x10 3/mm — ABNORMAL HIGH (ref 1.4–6.5)
Neutrophil %: 72.3 %
Platelet: 226 x10 3/mm (ref 150–440)
RBC: 4.65 10*6/uL (ref 3.80–5.20)
RDW: 13.1 % (ref 11.5–14.5)
WBC: 10.3 x10 3/mm (ref 3.6–11.0)

## 2014-03-03 LAB — T4, FREE: Free Thyroxine: 1.08 ng/dL (ref 0.76–1.46)

## 2014-03-03 LAB — TSH: THYROID STIMULATING HORM: 0.705 u[IU]/mL

## 2014-03-03 LAB — FERRITIN: Ferritin (ARMC): 43 ng/mL (ref 8–388)

## 2014-03-17 ENCOUNTER — Ambulatory Visit: Payer: Self-pay | Admitting: Oncology

## 2014-04-04 ENCOUNTER — Encounter: Payer: Self-pay | Admitting: Neurology

## 2014-04-04 ENCOUNTER — Ambulatory Visit (INDEPENDENT_AMBULATORY_CARE_PROVIDER_SITE_OTHER): Payer: Medicaid Other | Admitting: Neurology

## 2014-04-04 ENCOUNTER — Encounter (INDEPENDENT_AMBULATORY_CARE_PROVIDER_SITE_OTHER): Payer: Self-pay

## 2014-04-04 VITALS — BP 97/68 | HR 89 | Ht 63.0 in | Wt 149.0 lb

## 2014-04-04 DIAGNOSIS — G43909 Migraine, unspecified, not intractable, without status migrainosus: Secondary | ICD-10-CM | POA: Insufficient documentation

## 2014-04-04 DIAGNOSIS — R519 Headache, unspecified: Secondary | ICD-10-CM

## 2014-04-04 DIAGNOSIS — R569 Unspecified convulsions: Secondary | ICD-10-CM

## 2014-04-04 DIAGNOSIS — R51 Headache: Secondary | ICD-10-CM

## 2014-04-04 MED ORDER — ELETRIPTAN HYDROBROMIDE 40 MG PO TABS: ORAL_TABLET | ORAL | Status: AC

## 2014-04-04 MED ORDER — NORTRIPTYLINE HCL 25 MG PO CAPS: ORAL_CAPSULE | ORAL | Status: AC

## 2014-04-04 MED ORDER — DIVALPROEX SODIUM ER 500 MG PO TB24
1000.0000 mg | ORAL_TABLET | Freq: Every day | ORAL | Status: AC
Start: 2014-04-04 — End: ?

## 2014-04-04 NOTE — Progress Notes (Signed)
PATIENT: Lindsay Ho DOB: 11/09/1981  HISTORICAL  Rubbie Al Decant Berent is a 33 yo RH female referred by her primary care physician Dr. Lacie ScottsNiemeyer for evaluation of seizure, and headaches, she drove herself to clinic today, lies in exam bed in dark, complaining of severe headache.  From referring record, she had past medical history of anemia, depression, anxiety, irritable bowel syndrome, chronic low back pain, seizure, headaches,  She is currently on polypharmacy treatment including Lyrica 75 mg twice a day, alprazolam 1 mg twice a day, Ambien 10 mg every night, iron supplement, multivitamin, ibuprofen, Tylenol, she also reported, that she smokes marijuana occasionally for nausea, and pain, once every two weeks. She is also taking Vimpat 100 mg twice a day, Depakote 500 mg every night for seizure like activity  She had a history of migraine headaches since high school, holoacranial severe pounding headaches, more to the right side, with associated light, noise sensitivity,  nauseous, lasting 2-3 hours, previously she has tried Imitrex, no help, Maxalt, no help, she was also getting a prescription of Topamax, complains of untolerable side effect, such as hallucinations, vomitting, buzzing in her head,  She used to have headache intermittently, over past 6 months, she has been having daily headaches, has tried over-the-counter ibuprofen, Tylenol, Vicodin, Imitrex, without helping,  She also complains of seizure like activity since February 2014, transient loss of consciousness, followed by confusion, couple times a week, she is taking Depakote 500 mg every day, Vimpat 100 one twice a day for that.  She had a history of hysterectomy   REVIEW OF SYSTEMS: Full 14 system review of systems performed and notable only for  fever, chills, weight gain, fatigue, blurry vision, double vision, anemia, easy bruising, feeling hot, increased thirst, memory loss, confusion, headaches, weakness, slurred speech,  dizziness, seizure, passing out, insomnia, anxiety, not enough sleep, decreased energy, racing thoughts  ALLERGIES: Allergies not on file  HOME MEDICATIONS: No current outpatient prescriptions on file prior to visit.   No current facility-administered medications on file prior to visit.    PAST MEDICAL HISTORY: No past medical history on file.  PAST SURGICAL HISTORY: No past surgical history on file.  FAMILY HISTORY: No family history on file.  SOCIAL HISTORY:  History   Social History  . Marital Status: Single    Spouse Name: N/A    Number of Children: 2  . Years of Education: N/A   Occupational History  . unemployed   Social History Main Topics  . Smoking status: Not on file  . Smokeless tobacco: Not on file  . Alcohol Use: Not on file  . Drug Use: Not on file  . Sexual Activity: Not on file   Other Topics Concern  . Not on file   Social History Narrative  . No narrative on file     PHYSICAL EXAM   Filed Vitals:   04/04/14 1420  BP: 97/68  Pulse: 89  Height: 5\' 3"  (1.6 m)  Weight: 149 lb (67.586 kg)    Not recorded    Body mass index is 26.4 kg/(m^2).   Generalized: In no acute distress  Neck: Supple, no carotid bruits   Cardiac: Regular rate rhythm  Pulmonary: Clear to auscultation bilaterally  Musculoskeletal: No deformity  Neurological examination  Mentation: Alert oriented to time, place, history taking, and causual conversation  Cranial nerve II-XII: Pupils were equal round reactive to light. Extraocular movements were full.  Visual field were full on confrontational test. Bilateral fundi were  sharp.  Facial sensation and strength were normal. Hearing was intact to finger rubbing bilaterally. Uvula tongue midline.  Head turning and shoulder shrug and were normal and symmetric.Tongue protrusion into cheek strength was normal.  Motor: Normal tone, bulk and strength.  Sensory: Intact to fine touch, pinprick, preserved vibratory  sensation, and proprioception at toes.  Coordination: Normal finger to nose, heel-to-shin bilaterally there was no truncal ataxia  Gait: Rising up from seated position without assistance, normal stance, without trunk ataxia, moderate stride, good arm swing, smooth turning, able to perform tiptoe, and heel walking without difficulty.   Romberg signs: Negative  Deep tendon reflexes: Brachioradialis 2/2, biceps 2/2, triceps 2/2, patellar 2/2, Achilles 2/2, plantar responses were flexor bilaterally.   DIAGNOSTIC DATA (LABS, IMAGING, TESTING) - I reviewed patient records, labs, notes, testing and imaging myself where available.  Lab Results  Component Value Date   WBC 11.8* 05/31/2007   HGB 11.9* 05/31/2007   HCT 35.0* 05/31/2007   MCV 73.7* 05/31/2007   PLT 324 05/31/2007      Component Value Date/Time   NA 138 05/31/2007 1856   K 4.1 05/31/2007 1856   CL 108 05/31/2007 1856   GLUCOSE 96 05/31/2007 1856   BUN 10 05/31/2007 1856   CREATININE 0.8 05/31/2007 1857     ASSESSMENT AND PLAN  Julann Al Decant Rion is a 33 y.o. female complains of  seizure like event, worsening frequent daily headaches, also his mood disorder,  1, complete evaluation with MRI of the brain with without contrast 2. EEG  3, increase her Depakote er to 2 tablets every night, add on nortriptyline titrating to 2 tablets every night  4.  Replax prn for migrain. 5. RTC with Eber Jonesarolyn in 2 months    Levert FeinsteinYijun Aneyah Lortz, M.D. Ph.D.  [facility] [address], [address] [place], [place] [address] [phone]

## 2014-04-17 ENCOUNTER — Ambulatory Visit (INDEPENDENT_AMBULATORY_CARE_PROVIDER_SITE_OTHER): Payer: Medicaid Other | Admitting: Radiology

## 2014-04-17 DIAGNOSIS — R51 Headache: Secondary | ICD-10-CM

## 2014-04-17 DIAGNOSIS — R519 Headache, unspecified: Secondary | ICD-10-CM

## 2014-04-20 ENCOUNTER — Ambulatory Visit
Admission: RE | Admit: 2014-04-20 | Discharge: 2014-04-20 | Disposition: A | Discharge status: Home / Self Care | Payer: Medicaid Other | Source: Ambulatory Visit | Attending: Neurology | Admitting: Neurology

## 2014-04-20 DIAGNOSIS — R51 Headache: Secondary | ICD-10-CM

## 2014-04-20 DIAGNOSIS — R519 Headache, unspecified: Secondary | ICD-10-CM

## 2014-04-20 DIAGNOSIS — G43909 Migraine, unspecified, not intractable, without status migrainosus: Secondary | ICD-10-CM

## 2014-04-20 MED ORDER — GADOBENATE DIMEGLUMINE 529 MG/ML IV SOLN
14.0000 mL | Freq: Once | INTRAVENOUS | Status: AC | PRN
  Administered 2014-04-20: 14 mL via INTRAVENOUS

## 2014-04-25 ENCOUNTER — Emergency Department: Payer: Self-pay | Admitting: Emergency Medicine

## 2014-04-25 NOTE — Progress Notes (Signed)
Quick Note:  Shared normal MR Brain results with patient, she verbalized understanding and is requesting that a copy be sent to her pcp ______

## 2014-05-05 NOTE — Procedures (Signed)
   HISTORY:  33 years old female, presenting with seizure like activity since February 2014, with transient loss of consciousness, followed by confusion, multiple recurrent episodes, couple times a week, while taking Depakote, and Vimpat.  TECHNIQUE:  16 channel EEG was performed based on standard 10-16 international system. One channel was dedicated to EKG, which has demonstrates normal sinus rhythm of 84 beats per minutes.  Upon awakening, the posterior background activity was well-developed, in alpha range, 9hz , with amplitude of 45 microvoltage, reactive to eye opening and closure.  There was no evidence of epileptiform discharge.  Photic stimulation was performed, which induced a symmetric photic driving.  Hyperventilation was performed, there was no abnormality elicit.  No sleep was achieved.  CONCLUSION: This is a normal awake EEG.  There is no electrodiagnostic evidence of epileptiform discharge

## 2014-06-14 IMAGING — CR DG CHEST 2V
1 series · 2 of 2 positions shown · non-contrast
Comparison: July 28, 2013

CLINICAL DATA: Fever and cough

EXAM:
CHEST  2 VIEW

[Series 1: w chest pa · 0.14mm/px · 2 of 2 slices shown]
[im 1/2]
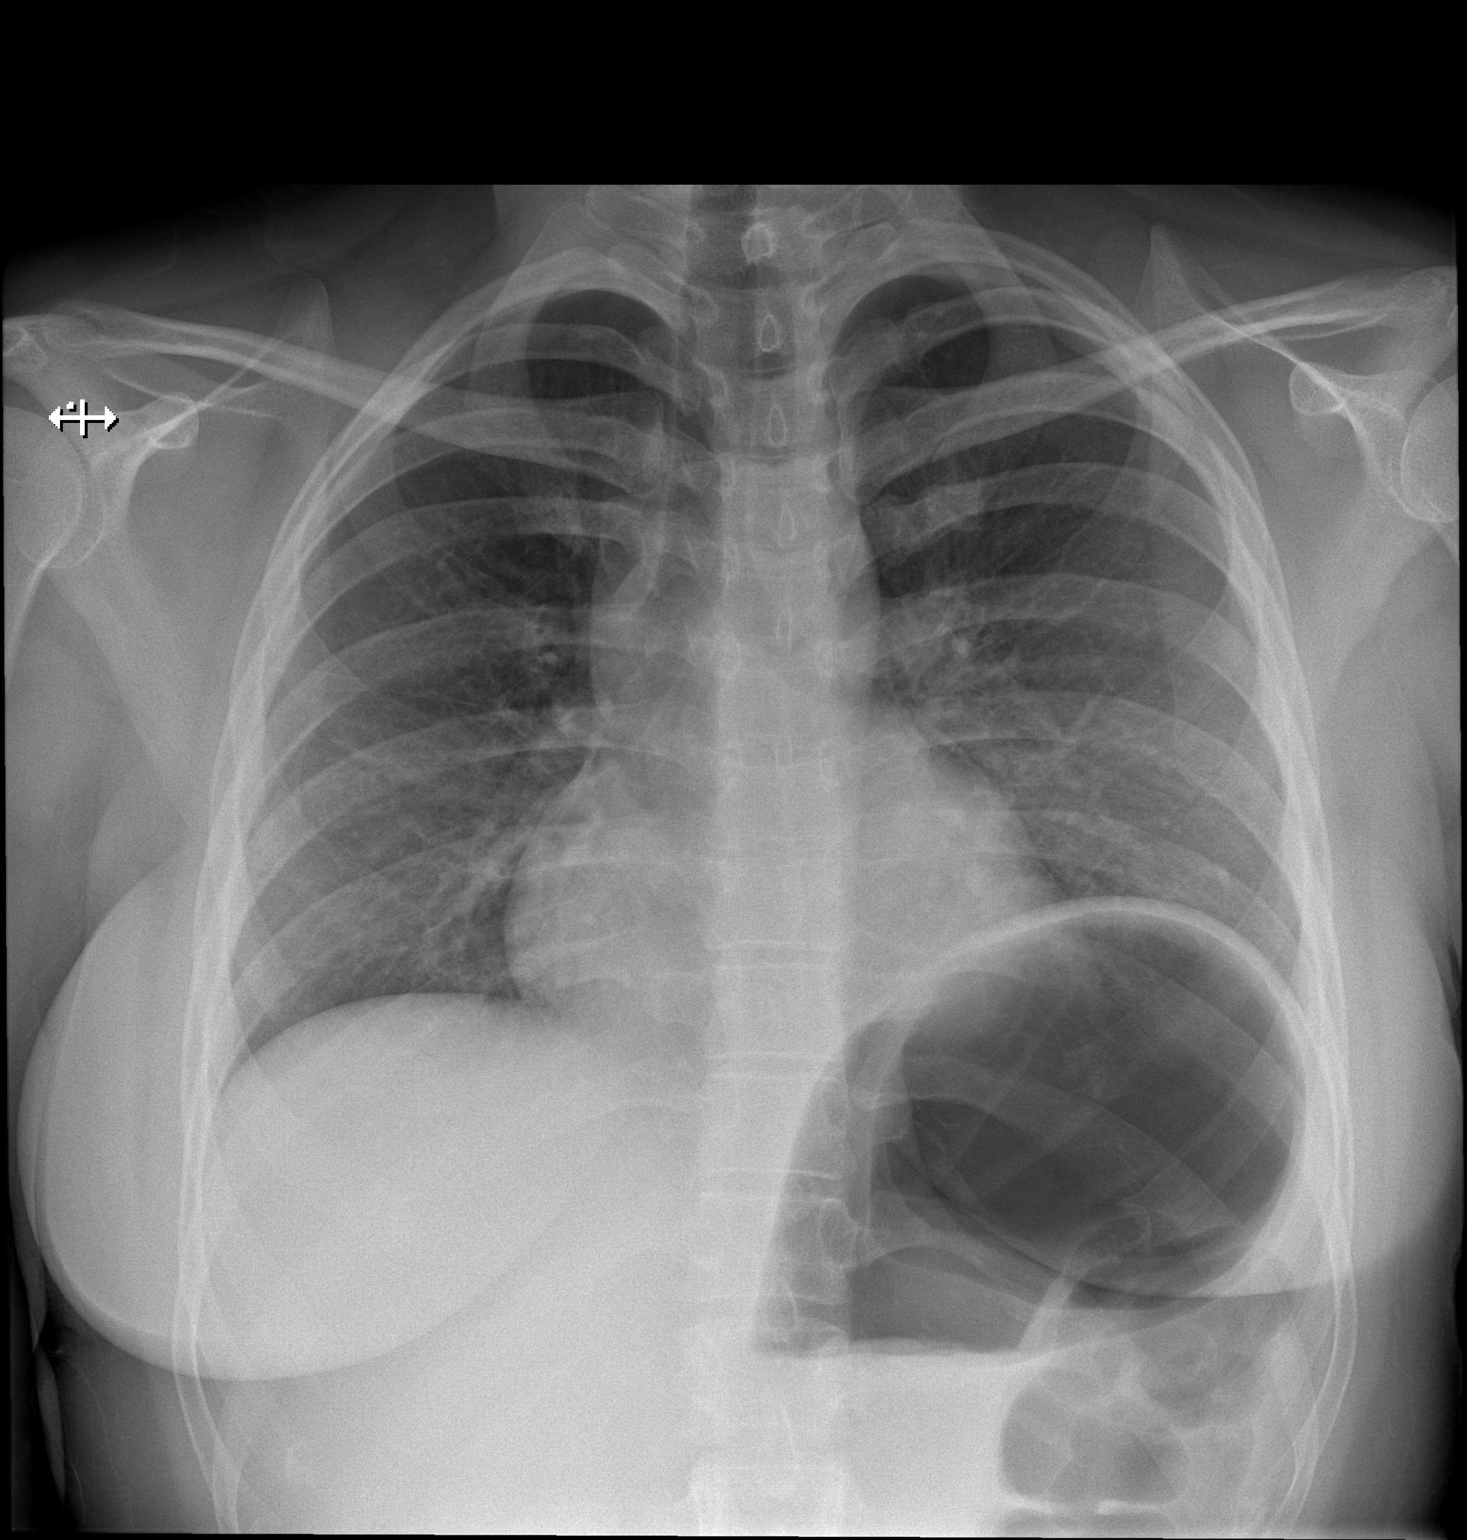
[im 2/2]
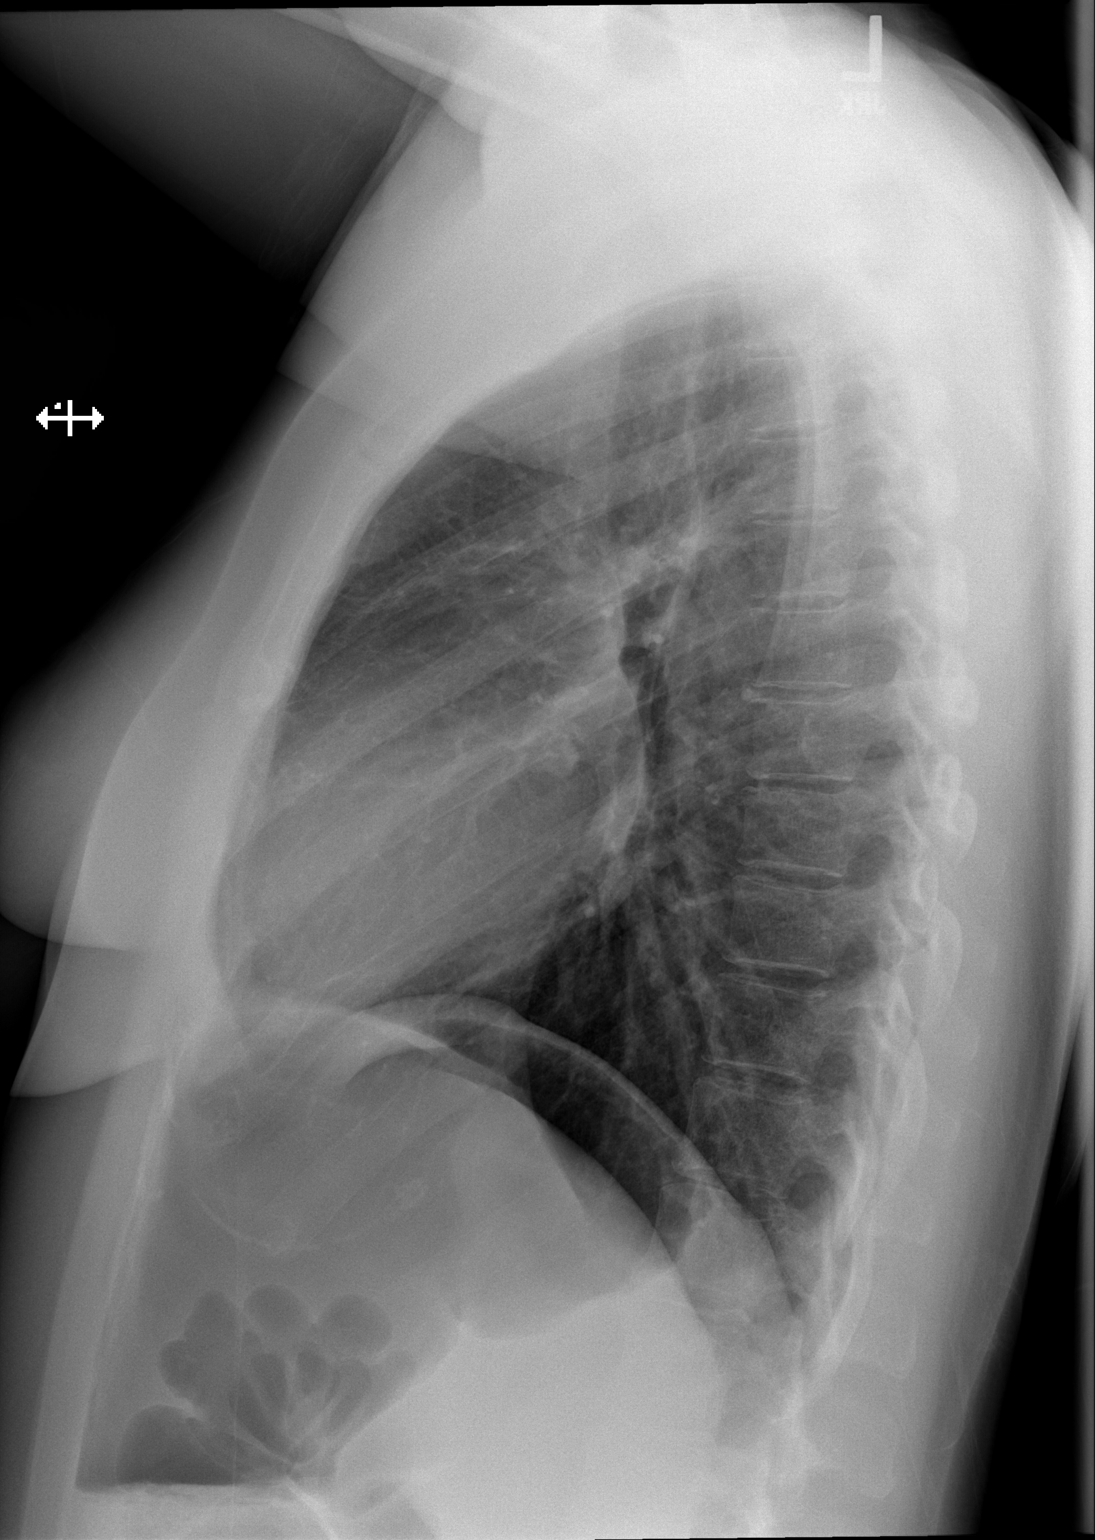

[2 of 2 positions shown; findings below may reference images not displayed]

FINDINGS: Lungs are clear. Heart size and pulmonary vascularity are normal. No
adenopathy. No bone lesions. There is dilatation of bowel in the
visualized upper abdomen.
IMPRESSION: No edema or consolidation.  Question a degree of underlying ileus.

## 2014-06-15 ENCOUNTER — Ambulatory Visit: Payer: Medicaid Other | Admitting: Nurse Practitioner

## 2014-06-15 ENCOUNTER — Telehealth: Payer: Self-pay | Admitting: Nurse Practitioner

## 2014-06-15 NOTE — Telephone Encounter (Signed)
No show for scheduled appt 

## 2014-11-15 IMAGING — US US PELV - US TRANSVAGINAL
1 series · 14 of 25 positions shown · non-contrast
Comparison: None.

Pelvic sonogram April 25, 2011

CLINICAL DATA: Left ovarian pain, status post hysterectomy and
right oophorectomy.

EXAM:
TRANSABDOMINAL AND TRANSVAGINAL ULTRASOUND OF PELVIS
DOPPLER ULTRASOUND OF OVARIES
TECHNIQUE: Both transabdominal and transvaginal ultrasound examinations of the
pelvis were performed. Transabdominal technique was performed for
global imaging of the pelvis including uterus, ovaries, adnexal
regions, and pelvic cul-de-sac.
It was necessary to proceed with endovaginal exam following the
transabdominal exam to visualize the left ovary. Color and duplex
Doppler ultrasound was utilized to evaluate blood flow to the ovary.

[Series 1: us pelv - us transvaginal · 0.24mm/px · 14 of 153 slices shown]
[im 1/153]
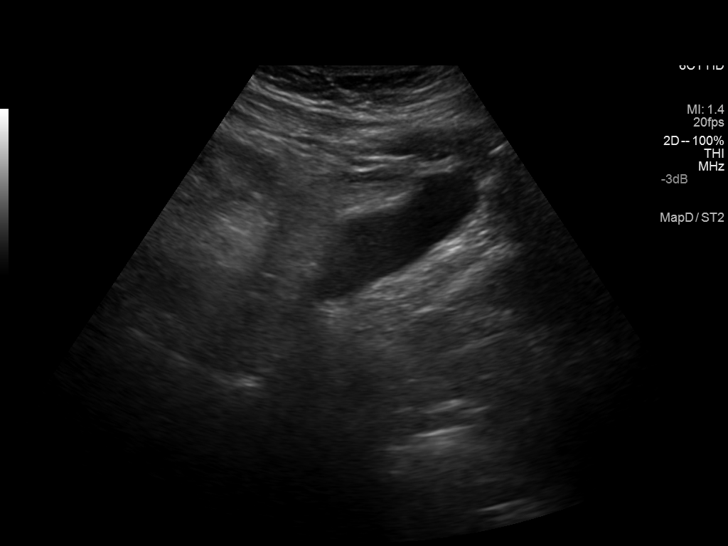
[im 13/153]
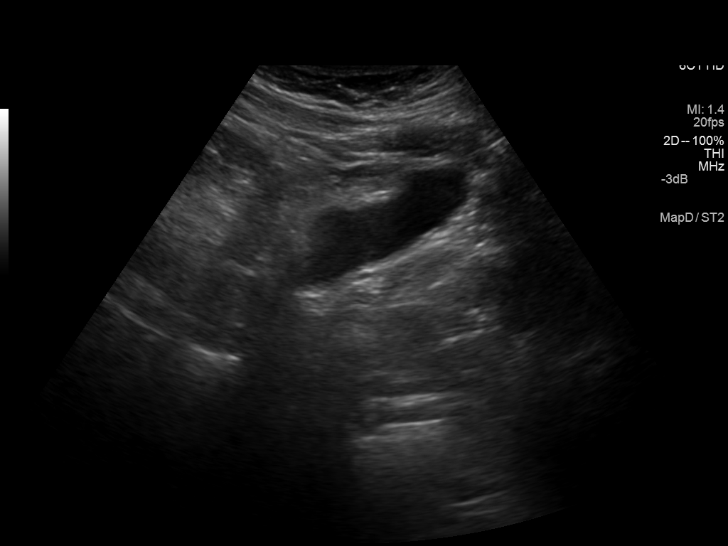
[im 26/153]
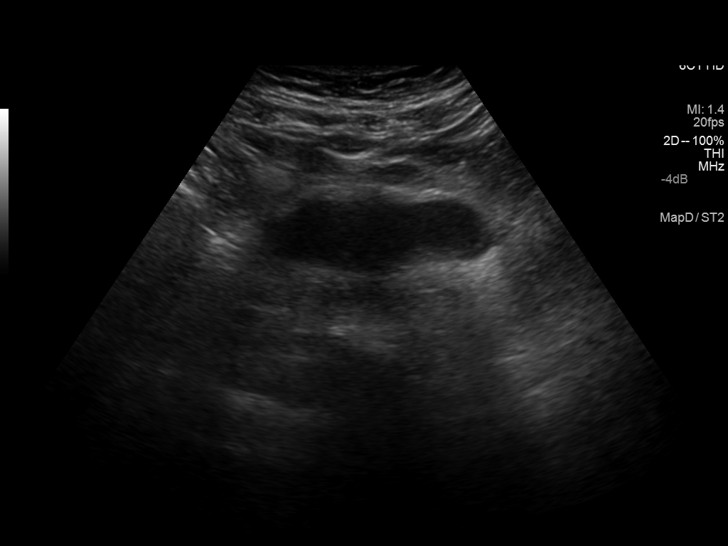
[im 39/153]
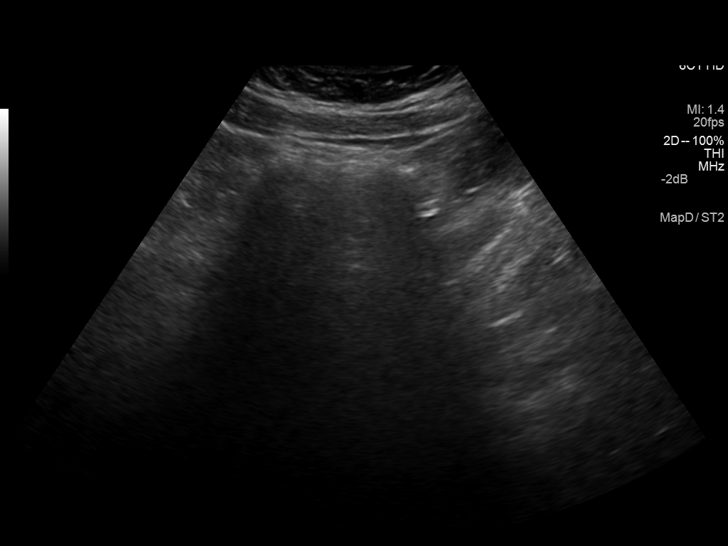
[im 51/153]
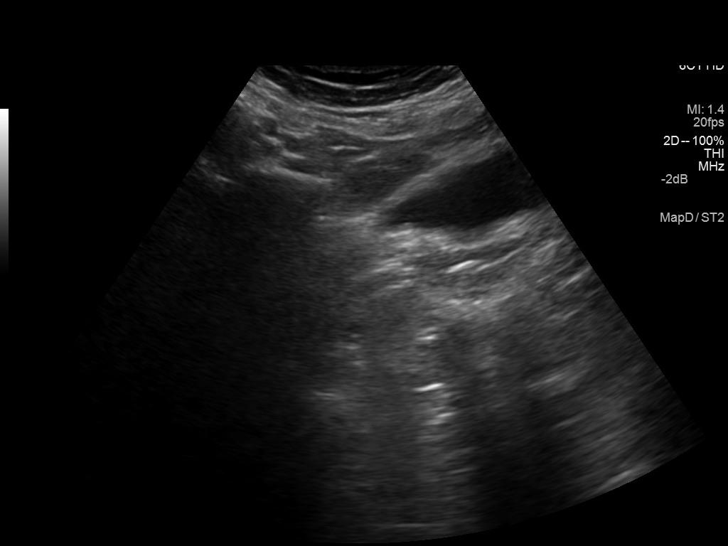
[im 58/153]
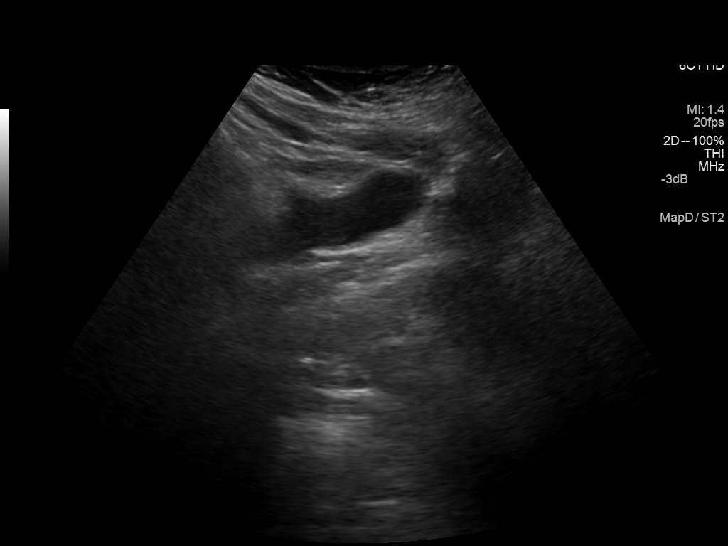
[im 70/153]
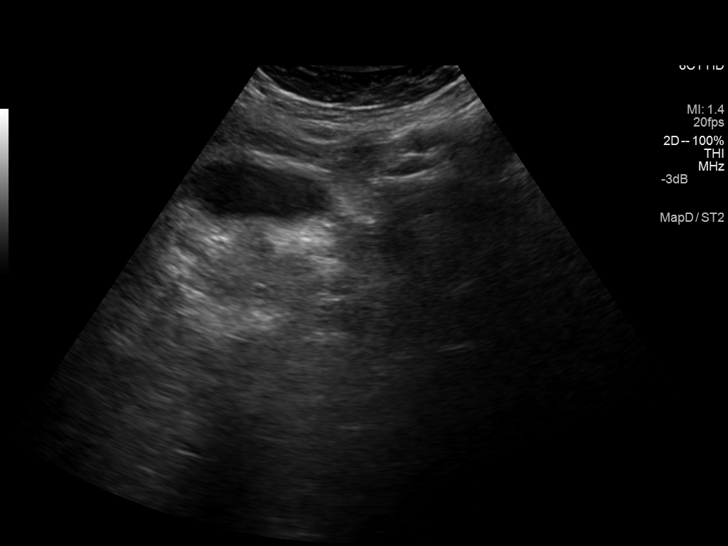
[im 83/153]
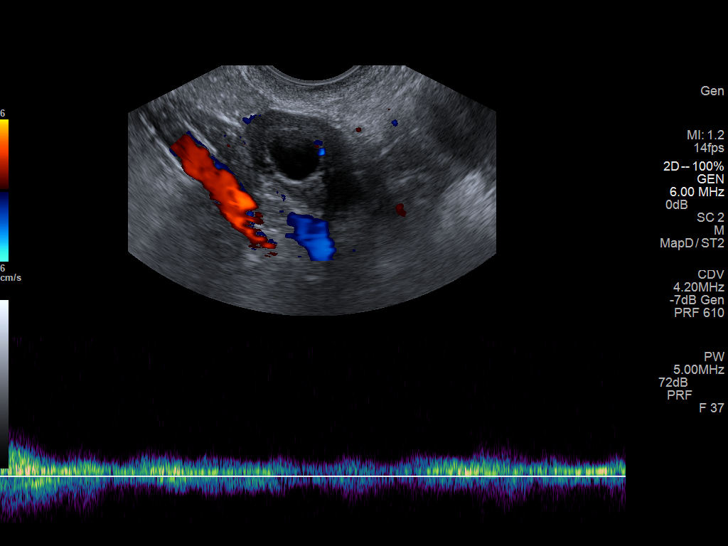
[im 96/153]
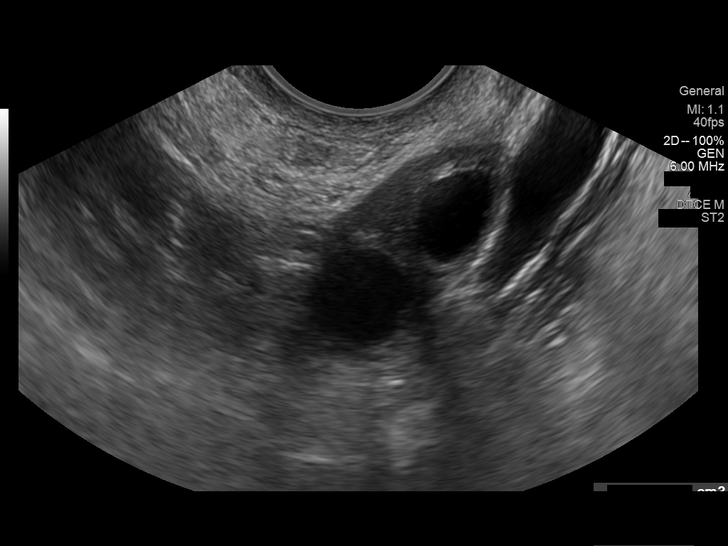
[im 102/153]
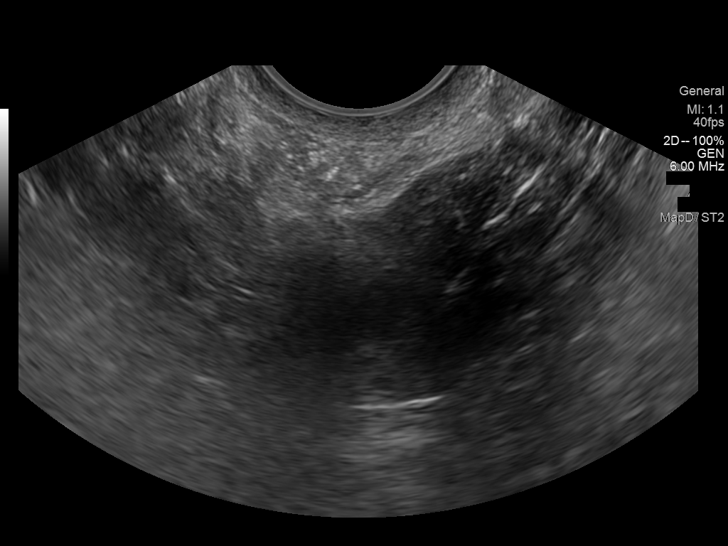
[im 115/153]
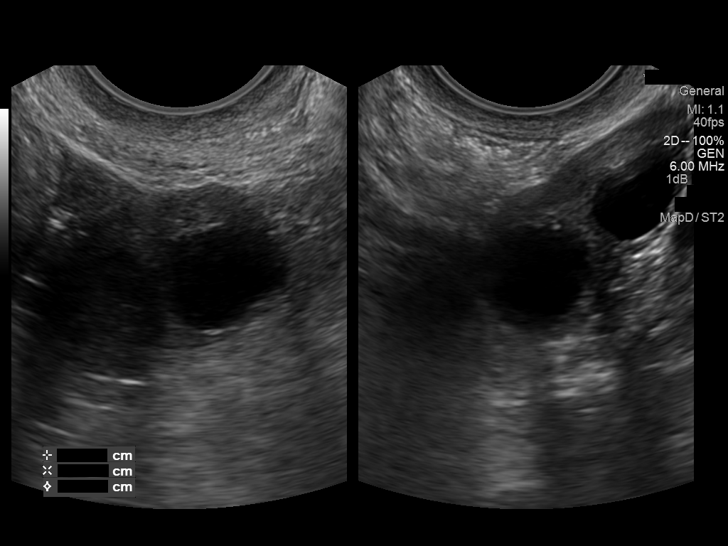
[im 127/153]
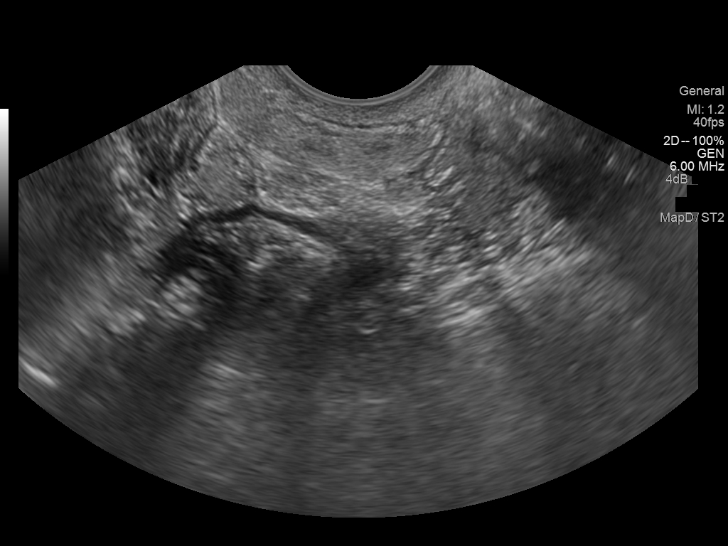
[im 140/153]
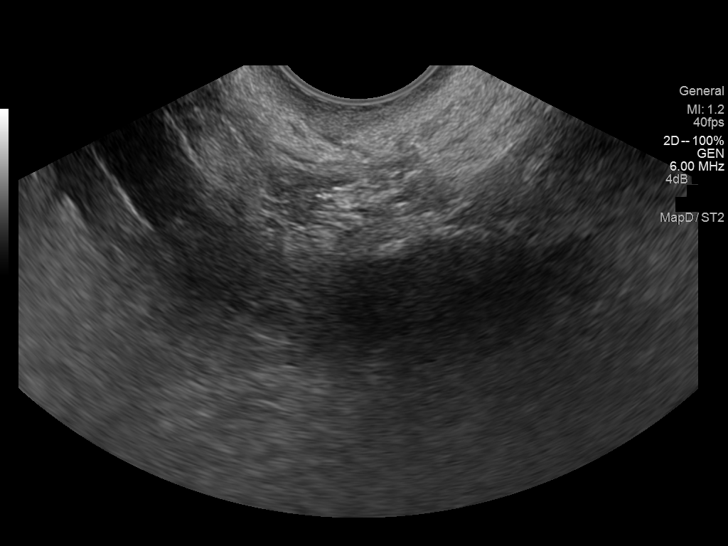
[im 153/153]
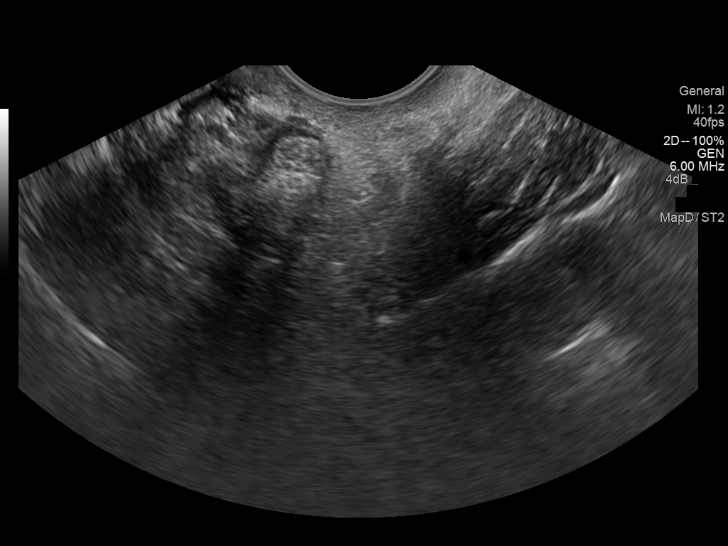

[14 of 25 positions shown; findings below may reference images not displayed]

FINDINGS: Uterus

Surgically absent.

Right ovary

Surgically absent appear.

Left ovary

Measurements: 3.6 x 2 x 1.9 cm. Normal appearance/no adnexal mass.
Multiple normal follicles.

Pulsed Doppler evaluation of left ovary demonstrates normal
low-resistance arterial and venous waveforms.

Other findings

No free fluid.
IMPRESSION: Normal sonogram of the left ovary.

Status post hysterectomy and right oophorectomy.

  By: Malesaj Montazni

## 2015-03-06 NOTE — Op Note (Signed)
PATIENT NAME:  Lindsay HidalgoHAUNER, Farron N MR#:  [id number] DATE OF BIRTH:  September 04, 1981  DATE OF PROCEDURE:  08/13/2012  PREOPERATIVE DIAGNOSES:  1. Right lower quadrant pain.  2. Chronic pelvic pain.   POSTOPERATIVE DIAGNOSES:  1. Right lower quadrant pain.  2. Chronic pelvic pain.   PROCEDURE: Operative laparoscopy with right salpingo-oophorectomy.   SURGEON: Dierdre Searles. Paul Jacobie Stamey, MD  ANESTHESIA: General.   ESTIMATED BLOOD LOSS: Minimal.   COMPLICATIONS: None.   FINDINGS: No signs of adhesions or endometriosis. Normal left ovary. Normal healing area from prior hysterectomy. Right ovary appeared normal, but there was mild torsion of the pedicle involving the ovary and fallopian tube.   DISPOSITION: To recovery room in stable condition.   TECHNIQUE: Patient is prepped and draped in usual sterile fashion after adequate anesthesia is obtained in the supine position on the Operating Room table. Marcaine 0.5% is used to anesthetize the skin in the area of the umbilicus followed by a small incision. A Veress needle is inserted with confirmation by hanging drop technique. 5 mm trocar is then inserted after the abdomen had been insufflated with CO2 gas under direct visualization with the laparoscope with no injuries or bleeding noted. Patient placed in Trendelenburg positioning with the above-mentioned findings visualized. A 5 mm trocar is placed in the left lower quadrant and an 11 mm trocar is placed in the suprapubic region for instrumentation purposes. The right pedicle is untwisted and is carefully coagulated and dissected free with complete amputation of ovary and fallopian tube. Excellent hemostasis noted at the operative site. The operative site as well out of harm's way from ureter or bowel or other structures. An Endopouch is placed and retrieval of the specimen is obtained. There is no bleeding or fluid in the pelvic cavity. Gas is expelled. Trocars are removed. The fascia in the suprapubic port is closed  with a 2-0 Vicryl suture and the skin is closed with Dermabond. Patient goes to recovery in stable condition. All sponge, instrument, and needle counts are correct.   ____________________________ R. Annamarie MajorPaul Cailee Blanke, MD rph:cms D: 08/13/2012 14:23:35 ET T: 08/13/2012 15:15:38 ET JOB#: [id number]  cc: Dierdre Searles. Paul Anarosa Kubisiak, MD, <Dictator>  Nadara MustardOBERT P Loyed Wilmes MD ELECTRONICALLY SIGNED 08/16/2012 5:09

## 2015-03-09 NOTE — Consult Note (Signed)
Chief Complaint:  Subjective/Chief Complaint Please see colonoscopy report.  Patietn tolerated colonoscopy well, no evidenc of bleeding lesion.  Recommend sbs and video capsule endoscopy.  These can be arranged in o/p fu.  Will start diet.   Electronic Signatures: Barnetta ChapelSkulskie, Gage Treiber (MD)  (Signed 10-Sep-14 16:30)  Authored: Chief Complaint   Last Updated: 10-Sep-14 16:30 by Barnetta ChapelSkulskie, Willow Shidler (MD)

## 2015-03-09 NOTE — H&P (Signed)
PATIENT NAME:  Lindsay Ho, Lindsay Ho MR#:  [id number] DATE OF BIRTH:  10/28/1981  DATE OF ADMISSION:  07/25/2013  PRIMARY CARE PHYSICIAN:  Dr.  Lacie ScottsNiemeyer.  EMERGENCY ROOM PHYSICIAN:  Dr. Fanny BienQuale.  CHIEF COMPLAINTS: Seizure, yesterday and today.   HISTORY OF PRESENT ILLNESS: A 34 year old Caucasian female with a history of iron deficiency anemia, bipolar disorder, anxiety disorder, MRSA history, facial periorbital cellulitis, presented to the ED with a seizure yesterday and today. The patient is alert, awake, oriented now. According to the patient, the patient had a seizure about 6 p.m. yesterday which had lasted  about 2 minutes. She lost consciousness and had incontinence. She also feels weak.   The patient developed a seizure again this morning which lasted about 45 seconds. She also lost  consciousness, but no incontinence. The patient was alert, awake thereafter. The patient said that according to weakness, the patient's seizure is her eyes staring off, but no body-shaking. In addition, the patient complaints of chest pain which is a constant, dull radiation which is constant, dull, no radiation. She also has palpitations, but she denies any orthopnea. No dyspnea. No leg edema. The patient also complains of abdominal pain on the left lower quadrant due to an ovarian cyst. She has seen OB/GYN as an outpatient.   The patient's hemoglobin was noted to be at 5.3 in the ED, and the patient said she has had bloody stools whenever she has had a bowel movement for the past 1 year. She feels weak, palpitations, and she always had a low blood pressure, but she denies any nausea, vomiting, or black stool. She denies any bloody urine.   PAST MEDICAL HISTORY: Bipolar disorder, anxiety disorder, iron deficiency anemia, MRSA, facial periorbital cellulitis.   SURGICAL HISTORY: Partial hysterectomy, cholecystectomy and appendectomy.   SOCIAL HISTORY: Smokes 4 cigarettes a day. The patient used to smoke one-half pack a  day since 34 years old, but no alcohol drinking or illicit drugs.     FAMILY HISTORY: No hypertension, diabetes, but has cancer history.   ALLERGIES: To: COMPAZINE, HYDROXYZINE, AND SULFA DRUGS, VANCOMYCIN, ZOFRAN.   HOME MEDICATIONS: Zolpidem 10 mg p.o. once a day at bedtime, p.r.Ho.; Xanax 0.5 mg p.o. b.i.d., multivitamin 1 tablet once a day, Lyrica 75 mg p.o. b.i.d., cyclobenzaprine 10 mg p.o. at bedtime.   REVIEW OF SYSTEMS:  CONSTITUTIONAL: The patient denies any fever or chills. No headache, but has dizziness and generalized weakness.  EYES: No double vision or blurred vision.  EARS, NOSE, THROAT: No postnasal drip, slurred speech or dysphagia.  CARDIOVASCULAR: Positive for chest pain, palpitations, but no orthopnea or nocturnal dyspnea. No leg edema.  PULMONARY: No cough, sputum or shortness of breath. No hemoptysis.  GASTROINTESTINAL: Positive for abdominal pain in the left lower quadrant, but no nausea or vomiting or diarrhea. The patient has constipation and bloody stool. No melena.  GENITOURINARY: No dysuria, hematuria, or incontinence.  SKIN: No rash or jaundice.  NEUROLOGIC: Positive for seizure, loss of consciousness, incontinence and possible syncope.  HEMATOLOGIC: No easy bruising or bleeding.  ENDOCRINOLOGIC: No polyuria, polydipsia, heat or cold intolerance.   PHYSICAL EXAMINATION: VITAL SIGNS: Temperature 98.6, blood pressure 94/50, pulse 100, respirations 18, oxygen saturation 99% on room air.  GENERAL: The patient is alert, awake, oriented, in no acute distress.  HEENT: Pupils round, equal and reactive to light and accommodation. Pale conjunctivae. Moist oral mucosa. Clear oropharynx.  NECK: Supple. No JVD or carotid bruit. No lymphadenopathy. No thyromegaly.  CARDIOVASCULAR: S1, S2, regular  rate, rhythm. No murmurs or gallops. No use of accessory muscles to breathe. ABDOMEN: Soft. No distention. Only mild tenderness in the left lower quadrant locally. No rebound. No  rigidity. Bowel sounds present. No obvious organomegaly.   EXTREMITIES: No edema, clubbing or cyanosis. No calf tenderness. Strong bilateral pedal pulses.  SKIN: No rash or jaundice.  NEUROLOGY: A and O x 3. No focal deficit. Power 5/5. Sensation intact.   LABORATORY DATA: CAT scan of head showed no acute intracranial abnormality.   Urinalysis is negative.   WBC 7.4, hemoglobin 5.3. Hematocrit  16, platelets 388, MCV 79, glucose 89, BUN 11, creatinine 0.62.   Electrolytes normal.   INR 1.0, PTT 30.3.   IMPRESSIONS: 1.  Gastrointestinal bleeding.  2.  Severe anemia.  3.  Questionable seizure, possible syncope due to anemia.  4.  Hypotension.  5.  Anxiety.  6.  Tobacco abuse.   PLAN OF TREATMENT: 1.  The patient will be admitted to the medical floor. We will keep Ho.p.o. except for medications. We will start normal saline IV at 25 mL/h, and start a Protonix drip. The patient will get a PRBC transfusion, 2 units. We will get a GI consult from Dr. Marva Panda.  2.  Will continue the patient's anxiety medication.  3.  Smoking cessation was counseled for 4 to 5 minutes.   I discussed the patient's condition and the plan of treatment with the patient.   Time spent: About sixty-two minutes.   ____________________________ Shaune Pollack, MD qc:dm D: 07/25/2013 15:09:38 ET T: 07/25/2013 15:28:51 ET JOB#: [id number]  cc: Shaune Pollack, MD, <Dictator> Shaune Pollack MD ELECTRONICALLY SIGNED 07/27/2013 15:06

## 2015-03-09 NOTE — Consult Note (Signed)
Brief Consult Note: Diagnosis: Seizure.   Patient was seen by consultant.   Consult note dictated.   Comments: Appreciate consult for 34 y/o caucasian woman with history of generalized anxiety, periorbital cellulitis related to MRSA, IDA, and partial hysterectomy with right oophrectomy due to cyst, admitted with the above , for evaluation of. In chart review, patient with hgb >13 until today, over the last year. 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  States she has never had an EGD in past. States over the last few weeks she has been generally weak and fatigues easily. Reports looking pale, decreased appetite, and having 2 seizure v. syncopal episodes last night.  States over the last several weeks she has been having intermittent loose stools with bright to dark red blood mixed in in varying amounts. Does report some LLQ pain that she attributes to a left ovarian cyst that has increased over the last 2w, and has recently seen Dr Tiburcio PeaHarris, her obgyn provider, about this. Also reports some intermittent epigastric discomfort that increases with tomato products and stress- and improves with Mylanta, and some intermittent NV. States sometimes her emesis has red stringy material in it. States she has lost 10lb over the last 2w. Denies acid reflux/heartburn, black stools, problems swallowing. Is currently receivin 1st of 3 units of PRBCs Impression and plan: Profound anemia, epigastric and LLQ abdominal pain with intermittent rectal bleeding and loose stools. Broad diff.  Agree with PPI gtt, transfusions. Would follow hemoglobin and will plan to start with EGD for luminal evaluation. Will likely also need colonoscopy and consider CT of abd/pelvis. Will try to add Fe studies to admission work blood.  Electronic Signatures: Vevelyn PatLondon, Latora Quarry H (NP)  (Signed 08-Sep-14 18:49)  Authored: Brief Consult Note   Last Updated: 08-Sep-14 18:49 by Keturah BarreLondon, Emmaline Wahba H (NP)

## 2015-03-09 NOTE — Consult Note (Signed)
PATIENT NAME:  Lindsay Ho, Lindsay Ho MR#:  [id number] DATE OF BIRTH:  02/06/1981  DATE OF CONSULTATION:  07/25/2013  REFERRING PHYSICIAN:  Shaune Pollack, MD CONSULTING PHYSICIAN:  Keturah Barre, NP  Appreciate consult ordered by Dr. Imogene Burn for evaluation of GI bleeding and anemia in a pleasant 34 year old Caucasian woman with history of generalized anxiety, bipolar disorder, periorbital cellulitis related to MRSA, IDA and partial hysterectomy with right oophorectomy due to cyst, admitted with the above for evaluate. In chart review, patient with hemoglobin greater than 13 until today over the last year. Did have colonoscopy in 2007 by Dr. Henrene Hawking for [facility] relating to her IBS. She states she has never had an EGD in the past. States over the last few weeks she has been generally weak and fatigues easily. Reports looking pale, decreased appetite and having 2 seizures versus syncopal episodes last night. States over the last several weeks she has been having intermittent loose stools with bright to dark red blood mixed in in varying amounts. Does report some left lower quadrant pain that she attributes to left ovarian cyst that has increased over the last 2 weeks and has recently been evaluated for this by Dr. Tiburcio Pea, her OB/GYN provider. Also reports some intermittent epigastric discomfort that increases with tomato products and stress, improves with Mylanta, and some intermittent nausea/vomiting. States sometimes her emesis has red streaky material in it. States she has lost 10 pounds over the last 2 weeks. Denies acid reflux, heartburn, black stools, problems swallowing. Is currently receiving first of 3 units of PRBCs.   PAST MEDICAL HISTORY: Bipolar disorder, anxiety disorder, IDA, MRSA, facial periorbital cellulitis, partial hysterectomy, right oophorectomy, cholecystectomy, appendectomy.   SOCIAL HISTORY: Smokes 4 cigarettes a day. 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 Never used illicits. Does not drink alcohol.   FAMILY HISTORY: Significant for cervical cancer, mesothelioma, breast cancer, uterine cancer. No colorectal cancer, liver disease, peptic ulcer disease, IBD or celiac.   ALLERGIES: COMPAZINE, HYDROXYZINE, SULFA, VANCOMYCIN AND ZOFRAN.   HOME MEDICATIONS: Ambien 10 mg p.o. daily at bedtime p.r.n., Xanax 0.5 mg p.o. b.i.d., multivitamin 1 tab p.o. daily, Lyrica 75 mg p.o. b.i.d., Flexeril 10 mg p.o. at bedtime, tramadol 50 mg p.o. b.i.d. to t.i.d. p.r.n.   REVIEW OF SYSTEMS: Ten systems reviewed, unremarkable other than what is noted above.   LABORATORY AND RADIOLOGICAL DATA: Most recent labs: Glucose 89, BUN 11, creatinine 0.62, sodium 137, potassium 4.1, chloride 106, GFR greater than 60, calcium 8.5, total serum protein 6.3, albumin 3.5, total bilirubin 0.2, alkaline phosphatase 59, AST 11, ALT 14. WBC 7.4, hemoglobin 5.3, hematocrit 16, platelet count 388, MCV 79, MCH 25.9, RDW 18.1. PT 13.2, INR of 1.0. CT of head: No acute intracranial abnormality.   PHYSICAL EXAMINATION: VITAL SIGNS: Most recent temperature 99, pulse 92, respiratory rate 16, blood pressure 93/58, oxygen saturation 100% on room air.  GENERAL: Pleasant young woman lying in bed in no acute distress.  HEENT: Normocephalic, atraumatic. Conjunctivae pale. No redness, drainage or inflammation to the eyes or nares. Sclerae are clear.  NECK: Supple. No thyromegaly, lymphadenopathy.  CARDIAC: S1, S2, regular rate and rhythm. No murmurs, rubs or gallops. No edema. Peripheral pulses 2+.  LUNGS: Respirations eupneic. Lungs CTAB.  ABDOMEN: Bowel sounds x 4. Flat, nondistended, soft. Only tenderness appreciable is to the left lower quadrant.  SKIN: Warm, dry, pale,  slightly pink. No erythema, lesion or rash.  NEUROLOGIC: Alert and oriented x 3. Cranial nerves II through XII intact. Speech clear. No  facial droop.  PSYCHIATRIC: Pleasant, cooperative, logical train of thought, mood stable.   IMPRESSION AND PLAN: Profound anemia, epigastric and left lower quadrant pain with intermittent rectal bleeding and loose stools. Broad differential. Agree with proton pump inhibitor drip, transfusions. Would follow hemoglobin and plan to start with EGD for luminal evaluation. Will also likely need colonoscopy and consider CT of abdomen and pelvis. Will try to add iron studies to admission blood work,   Thank you very much for this consult.   These services were provided by Vevelyn Pathristiane London, MSN, [facility], in collaboration with Christena DeemMartin U. Skulskie, MD, with whom I have discussed this patient in full.   ____________________________ Keturah Barrehristiane H. London, NP chl:jm D: 07/25/2013 21:10:38 ET T: 07/25/2013 21:39:40 ET JOB#: [id number]  cc: Keturah Barrehristiane H. London, NP, <Dictator> Eustaquio MaizeHRISTIANE H LONDON FNP ELECTRONICALLY SIGNED 08/22/2013 10:02

## 2015-03-09 NOTE — Discharge Summary (Signed)
PATIENT NAME:  Lindsay Ho, Lindsay Ho MR#:  [id number] DATE OF BIRTH:  1981-01-11  DATE OF ADMISSION:  07/25/2013 DATE OF DISCHARGE:  07/30/2013  ADMITTING PHYSICIAN: Lindsay Ho.   DISCHARGING PHYSICIAN: Dr. Enid Ho.   PRIMARY CARE PHYSICIAN: Dr. Lacie Ho.   CONSULTATIONS IN THE HOSPITAL: GI consultation by Dr. Ricki Ho.   DISCHARGE DIAGNOSES: 1.  Acute-on-chronic anemia requiring 2 units packed red blood cell transfusions during this admission.  2.  Iron-deficiency anemia.  3.  Acute respiratory distress episode, with laryngitis and bronchitis.  4.  Low-normal blood pressure.  5.  Depression and anxiety.  6.  Tobacco use disorder.   DISCHARGE HOME MEDICATIONS:  1.  Multivitamin 1 tablet p.o. daily.  2.  Percocet 5/325 mg, 1 tablet q.4h. p.r.n.  3.  Lyrica 75 mg  p.o. b.i.d.  4.  Tessalon Perles 100 mg p.o. q.6h. p.r.n.  5.  Robitussin AC 5 mL q.6h. p.r.n. for cough.  6.  Levaquin 500 mg p.o. daily for 4 days.  7.  Flexeril 10 mg p.o. at bedtime.  8.  Ferrous sulfate 325 mg p.o. t.i.d.  9.  Ambien 10 mg at bedtime.  10.  Xanax 0.5 mg p.o. b.i.d.  11.  Prednisone taper.   DISCHARGE HOME OXYGEN: None.   DISCHARGE DIET: Regular diet.   DISCHARGE ACTIVITY: As tolerated.    FOLLOWUP INSTRUCTIONS: PCP follow-up in two weeks.   OB/GYN followup for ovarian cyst, as previously scheduled.   LABS AND IMAGING STUDIES PRIOR TO DISCHARGE: Sodium 135, potassium 4.3, chloride 105, bicarbonate 26, BUN 11, creatinine 0.59, glucose 131 and calcium of 8.7.   Chest x-ray showing no evidence of pneumonia, CHF. Mild hyperinflation; possible reactive airway disease and bronchitis.   WBC 7.5, hemoglobin 8.1, hematocrit 22.8, platelet count is 316.   Biopsies from the EGD showing negative for dysplasia and malignancy; only chronic inflammation noted. TSH 1.5.   CT of the head done on admission showing no acute intracranial abnormality.   Serum iron was low at 14. Iron-binding capacity was  elevated at 482. Ferritin was low at 2.  BRIEF HOSPITAL COURSE: Lindsay Ho is a year-old Caucasian female with a known history of iron deficiency anemia who was on iron supplements in the past, bipolar disorder with depression and anxiety, presents to the hospital secondary to possible seizure-like episode. She was noted to be extremely anemic, with hemoglobin of 5.3, and was admitted.   Acute-on-chronic anemia: The patient did require 2 units of packed RBC transfusions in the hospital. That brought her hemoglobin up to 8, and it has remained steady there. She had an EGD done which did not show any acute abnormalities other than chronic inflammation, and also had a colonoscopy done which showed a completely normal colon. GI recommended outpatient followup for possible capsule endoscopy. She was also told by OB/GYN that she has an ovarian cyst, and will need an OB/GYN workup. However she has not had further bleeding in the hospital so she is being discharged on iron supplements.  Acute respiratory distress, likely secondary to acute laryngitis and bronchitis. This happened on day 3 of her hospitalization. The patient had an episode of a worsening cough. Her respiratory distress improved with racemic epinephrine and Ativan for anxiety for the episode She was started on steroids and antibiotics and that has improved a lot. She does not have any stridor at this time. Chest x-ray was clear other than some bronchitis, so she was being discharged on a prednisone taper and antibiotics to finish  up the course and on an albuterol inhaler as needed.   Depression and anxiety, and neuropathy: Her home medications were continued without any changes.   Tobacco use disorder: She was counseled on admission.   Her course has been otherwise uneventful in the hospital.   DISCHARGE CONDITION: Stable.   DISCHARGE DISPOSITION: Home.   Time spent on discharge was forty-five minutes.      ____________________________ Lindsay Baasadhika Bernise Sylvain, MD rk:dm D: 07/31/2013 14:28:53 ET T: 07/31/2013 15:51:20 ET JOB#: [id number]  cc: Lindsay Baasadhika Kahner Yanik, MD, <Dictator> Lindsay A. Lindsay ScottsNiemeyer, MD Lindsay BaasADHIKA Tyreece Gelles MD ELECTRONICALLY SIGNED 08/03/2013 10:19

## 2015-03-09 NOTE — Consult Note (Signed)
Chief Complaint:  Subjective/Chief Complaint patietn tolerated tfx well, some headache today, no n/v or abdominalpain.  no bm. continues with mild llq pain.   VITAL SIGNS/ANCILLARY NOTES: **Vital Signs.:   09-Sep-14 05:16  Vital Signs Type Routine  Temperature Temperature (F) 98.4  Celsius 36.8  Temperature Source oral  Pulse Pulse 80  Respirations Respirations 18  Systolic BP Systolic BP 94  Diastolic BP (mmHg) Diastolic BP (mmHg) 62  Mean BP 72  Pulse Ox % Pulse Ox % 100  Pulse Ox Activity Level  At rest  Oxygen Delivery Room Air/ 21 %   Brief Assessment:  Cardiac Regular   Respiratory clear BS   Gastrointestinal details normal Soft  Nontender  Nondistended  No masses palpable  Bowel sounds normal   Lab Results: Thyroid:  09-Sep-14 07:08   Thyroid Stimulating Hormone 1.50 (0.45-4.50 (International Unit)  ----------------------- Pregnant patients have  different reference  ranges for TSH:  - - - - - - - - - -  Pregnant, first trimetser:  0.36 - 2.50 uIU/mL)  Routine Chem:  08-Sep-14 11:44   Iron Binding Capacity (TIBC)  482  Iron, Serum  14  Iron Saturation 3 (Result(s) reported on 25 Jul 2013 at 07:33PM.)  09-Sep-14 07:08   Glucose, Serum 80  BUN  4  Creatinine (comp) 0.68  Sodium, Serum 141  Potassium, Serum 3.9  Chloride, Serum  112  CO2, Serum 24  Calcium (Total), Serum  7.4  Anion Gap  5  Osmolality (calc) 277  eGFR (African American) >60  eGFR (Non-African American) >60 (eGFR values <13m/min/1.73 m2 may be an indication of chronic kidney disease (CKD). Calculated eGFR is useful in patients with stable renal function. The eGFR calculation will not be reliable in acutely ill patients when serum creatinine is changing rapidly. It is not useful in  patients on dialysis. The eGFR calculation may not be applicable to patients at the low and high extremes of body sizes, pregnant women, and vegetarians.)  Magnesium, Serum 2.2 (1.8-2.4 THERAPEUTIC  RANGE: 4-7 mg/dL TOXIC: > 10 mg/dL  -----------------------)  Routine Hem:  08-Sep-14 11:43   Hemoglobin (CBC)  5.3  09-Sep-14 07:08   WBC (CBC) 7.5  RBC (CBC)  2.99  Hemoglobin (CBC)  8.1  Hematocrit (CBC)  23.8  Platelet Count (CBC) 316  MCV  79  MCH 27.1  MCHC 34.1  RDW  16.2  Neutrophil % 53.9  Lymphocyte % 37.3  Monocyte % 5.7  Eosinophil % 2.3  Basophil % 0.8  Neutrophil # 4.0  Lymphocyte # 2.8  Monocyte # 0.4  Eosinophil # 0.2  Basophil # 0.1 (Result(s) reported on 26 Jul 2013 at 0[facility])   Radiology Results: CT:    08-Sep-14 12:32, CT Head Without Contrast  CT Head Without Contrast   REASON FOR EXAM:    SEIZURE LIKE EPISODE  COMMENTS:       PROCEDURE: CT  - CT HEAD WITHOUT CONTRAST  - Jul 25 2013 12:32PM     RESULT: Axial noncontrast CT scanning was performed through the brain   with reconstructions at 5 mm intervals and slice thicknesses. Comparison   is made to the study of December 18, 2012.    The ventricles are normal in size and position. There is no intracranial   hemorrhage nor intracranial mass. There is no evidence of an evolving   ischemic infarction. The cerebellumand brainstem are normal in density.    At bone window settings the observed portions of the paranasal  sinuses   and mastoid air cells are clear. There is no evidence of a skull fracture.  IMPRESSION:  There is no acute intracranial abnormality.     Dictation Site: 1        Verified By: DAVID A. Martinique, M.D., MD   Assessment/Plan:  Assessment/Plan:  Assessment 1) severe IDA, intermittant rectal bleeding 2) llq pain-h/o ovarian cyst   Plan 1) egd today, then possible colonoscopy tomorrow.  I have discussed the risks benefis and complicatiosn of egd and colonoscopy to include nto limited to bleeding infection perforation and sedation and she wishes to proceed.  Further recs to follow.   Electronic Signatures: Loistine Simas (MD)  (Signed 09-Sep-14 12:52)  Authored: Chief  Complaint, VITAL SIGNS/ANCILLARY NOTES, Brief Assessment, Lab Results, Radiology Results, Assessment/Plan   Last Updated: 09-Sep-14 12:52 by Loistine Simas (MD)

## 2015-03-09 NOTE — Consult Note (Signed)
Chief Complaint:  Subjective/Chief Complaint Please see full GI consult.  patietn seen and examined, chart reviewed.  Patietn admitted with episode of ams, found to be profoundly anemic on labs, microcytic.  Reports intermittant black stools over several months, infrequent nsaids, frequent n/v.  Currently on iv ppi.  Will Proceed with egd tomorrow, likely pm, then colonoscopy the following day.  May need further testing depending on the above.  I have discussed wht risks benefits and complications of egd and colonoscopy to include not limited to bleeding infection perforation and sedation and she wishes to proceed.  Further recs to follow.   VITAL SIGNS/ANCILLARY NOTES: **Vital Signs.:   08-Sep-14 18:33  Vital Signs Type 15 min Post Blood Start Time  Temperature Temperature (F) 98.7  Celsius 37  Temperature Source oral  Pulse Pulse 95  Respirations Respirations 16  Systolic BP Systolic BP 89  Diastolic BP (mmHg) Diastolic BP (mmHg) 57  Mean BP 67  Pulse Ox % Pulse Ox % 100  Oxygen Delivery Room Air/ 21 %   Brief Assessment:  Cardiac Regular   Respiratory clear BS   Gastrointestinal details normal Soft  Nontender  Nondistended  No masses palpable  Bowel sounds normal  No rebound tenderness   Lab Results: Routine Chem:  08-Sep-14 11:44   Iron Binding Capacity (TIBC)  482  Unbound Iron Binding Capacity 468  Iron, Serum  14  Iron Saturation 3 (Result(s) reported on 25 Jul 2013 at 07:33PM.)  Routine Hem:  08-Sep-14 11:43   WBC (CBC) 7.4  RBC (CBC)  2.04  Hemoglobin (CBC)  5.3  Hematocrit (CBC)  16.0  Platelet Count (CBC) 388  MCV  79  MCH  25.9  MCHC 32.9  RDW  18.1  Neutrophil % 60.0  Lymphocyte % 28.6  Monocyte % 7.9  Eosinophil % 2.8  Basophil % 0.7  Neutrophil # 4.4  Lymphocyte # 2.1  Monocyte # 0.6  Eosinophil # 0.2  Basophil # 0.0 (Result(s) reported on 25 Jul 2013 at 12:09PM.)   Electronic Signatures: Barnetta ChapelSkulskie, Deano Tomaszewski (MD)  (Signed 08-Sep-14  19:50)  Authored: Chief Complaint, VITAL SIGNS/ANCILLARY NOTES, Brief Assessment, Lab Results   Last Updated: 08-Sep-14 19:50 by Barnetta ChapelSkulskie, Branden Vine (MD)

## 2015-03-09 NOTE — Consult Note (Signed)
Chief Complaint:  Subjective/Chief Complaint Please see full EGD report.  No evidence of chronic bleeding lesion in the upper scope.  GE junction irregular, possible barretts, biopsied.  Will arrange for colonoscopy tomorrow pm.  Continue current.   Electronic Signatures: Barnetta ChapelSkulskie, Wren Pryce (MD)  (Signed 09-Sep-14 13:16)  Authored: Chief Complaint   Last Updated: 09-Sep-14 13:16 by Barnetta ChapelSkulskie, Julya Alioto (MD)

## 2015-03-09 NOTE — Consult Note (Signed)
Chief Complaint:  Subjective/Chief Complaint tolerated bowel prep for colonoscopy, no evidence of GI bleeding.  no n/v or abd pain.   VITAL SIGNS/ANCILLARY NOTES: **Vital Signs.:   10-Sep-14 04:57  Vital Signs Type Routine  Temperature Temperature (F) 98.2  Celsius 36.7  Temperature Source oral  Pulse Pulse 87  Respirations Respirations 18  Systolic BP Systolic BP 88  Diastolic BP (mmHg) Diastolic BP (mmHg) 53  Mean BP 64  Pulse Ox % Pulse Ox % 97  Pulse Ox Activity Level  At rest  Oxygen Delivery Room Air/ 21 %   Brief Assessment:  Cardiac Regular   Respiratory clear BS   Gastrointestinal details normal Soft  Nontender  Nondistended  No masses palpable  Bowel sounds normal   Lab Results: Routine Chem:  08-Sep-14 11:43   BUN 11  09-Sep-14 07:08   BUN  4  Routine Hem:  08-Sep-14 11:43   Hemoglobin (CBC)  5.3  09-Sep-14 07:08   Hemoglobin (CBC)  8.1  10-Sep-14 12:19   Hemoglobin (CBC)  8.6 (Result(s) reported on 27 Jul 2013 at 12:34PM.)   Assessment/Plan:  Assessment/Plan:  Assessment 1) profound anemia, IDA.  hemodynamically stable, appropriate response to tfx. egd showing mild esophagigis/gastritis, no bleeding lesion to account for level of anemia.   Plan 1) colonoscopy today, further recs to follow.  I have discussed the risks benefits and complications of egd to include not limited to bleeding infection perforation and sedation and she wishes to proceed.   Electronic Signatures: Barnetta ChapelSkulskie, Martin (MD)  (Signed 10-Sep-14 13:49)  Authored: Chief Complaint, VITAL SIGNS/ANCILLARY NOTES, Brief Assessment, Lab Results, Assessment/Plan   Last Updated: 10-Sep-14 13:49 by Barnetta ChapelSkulskie, Martin (MD)

## 2015-03-11 NOTE — Discharge Summary (Signed)
PATIENT NAME:  Lindsay HidalgoHAUNER, Ruhee N MR#:  [id number] DATE OF BIRTH:  12/22/1980  DATE OF ADMISSION:  03/19/2012 DATE OF DISCHARGE:  03/20/2012  DIAGNOSES:  1. 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  2. Leukocytosis. 3. Smoking.   DISPOSITION: The patient is being discharged home.   FOLLOW-UP: Follow-up with the [facility] in 1 to 2 weeks after discharge.   DIET: Regular.   ACTIVITY: As tolerated.   DISCHARGE MEDICATIONS:  1. Zyvox 600 mg b.i.d. for 12 days.  2. Adderall 10 mg once a day.   LABORATORY, DIAGNOSTIC, AND RADIOLOGICAL DATA: CMP normal. White count 13.7 on admission, 11.3 by the time of discharge.   HOSPITAL COURSE: The patient is a 34 year old female with past medical history of MRSA colonization which could not be eradicated, history of MRSA cellulitis 06/2011 who presented with fever, swelling over the left forehead extending to the medial side of the eye and left upper eyebrow. She was admitted with a diagnosis of possible recurrent MRSA furunculosis with possible preseptal cellulitis. The patient had a similar in 06/2011 and has failed eradication of MRSA in the past. Her white count was elevated on admission. She was admitted to the hospital and started on IV vancomycin with significant improvement in her cellulitis. She was afebrile. Her leukocytosis improved. She was switched to oral Zyvox. She had no insurance, therefore, she was provided with oral Zyvox therapy for 12 days by the case managers. She was counseled extensively about smoking cessation. She is being discharged home in a stable condition. Her cellulitis is significantly improved.  TIME SPENT: 45 minutes.   ____________________________ Darrick MeigsSangeeta Rickell Wiehe, MD sp:drc D: 03/21/2012 13:12:23 ET T: 03/21/2012 13:38:02 ET JOB#: [id number]  cc: Darrick MeigsSangeeta Limmie Schoenberg, MD, <Dictator> [facility] Darrick MeigsSANGEETA Aasia Peavler MD ELECTRONICALLY SIGNED 03/22/2012 13:16

## 2015-03-11 NOTE — H&P (Signed)
PATIENT NAME:  Lindsay Ho, Lindsay Ho MR#:  [id number] DATE OF BIRTH:  Dec 21, 1980  DATE OF ADMISSION:  03/19/2012  CHIEF COMPLAINT: Periorbital cellulitis.   HISTORY OF PRESENT ILLNESS: Ms. Vanderslice is a 34 year old pleasant Caucasian female who presented to the Emergency Department with left forehead and periorbital swelling and cellulitis that started two days ago with swelling and tenderness on the medial side of the left eyebrow progressed to cause periorbital swelling and cellulitis. The patient had fever of 101 two days ago but she took Tylenol and defervesced. She had some chills earlier today. The facial pain is described as a sharp pain, 6 on a scale of 10 in terms of severity. The patient had prior history of MRSA and last admission to this hospital was in August of 2012 when she was treated for right facial cellulitis and swelling.   REVIEW OF SYSTEMS: CONSTITUTIONAL: Reports fever two days ago but defervesced. Reports chills. No fatigue. No sweating. EYES: No blurring of vision, no double vision but she has swelling around the left orbital area. ENT: No hearing impairment. No sore throat. No dysphagia. CARDIOVASCULAR: No chest pain. No shortness of breath. No syncope. No edema. RESPIRATORY: No cough. No shortness of breath. No chest pain. GASTROINTESTINAL: No abdominal pain. No nausea. No vomiting. No diarrhea. GENITOURINARY: No dysuria. No frequency of urination. MUSCULOSKELETAL: No joint pain or swelling. No muscular pain or swelling. INTEGUMENTARY: No skin rash. No ulcers. NEUROLOGY: No focal weakness. No seizure activity. No headache other than the tenderness on the left periorbital area. PSYCHIATRY: No anxiety, no depression but history of bipolar. ENDOCRINE: No polyuria or polydipsia. No heat or cold intolerance.   PAST MEDICAL HISTORY:  1. History of right facial periorbital cellulitis in August 2012.  2. History of recurrent Methicillin-resistant Staphylococcus aureus.  3. History of iron  deficiency anemia.  4. History of bipolar disorder. 5. Anxiety disorder.   PAST SURGICAL HISTORY:  1. History of cholecystectomy.  2. Partial hysterectomy.   SOCIAL HABITS: Chronic smoker of half a pack a day since age of 34. No history of alcohol or drug abuse.   SOCIAL HISTORY: She is married, living with her husband. She has two children. The patient is unemployed. She works just as a housewife.   FAMILY HISTORY: Her mother and grandmother suffer from manic depressive illness.   ADMISSION MEDICATIONS: Adderall.   ALLERGIES: Reports Compazine, Zofran, and hydroxyzine causing altered mental status. Ibuprofen and sulfa causing rash. She has no allergy to vancomycin other than development of red skin if the infusion is fast.   PHYSICAL EXAMINATION:   VITAL SIGNS: Blood pressure 106/61, respiratory rate 18, pulse 104, temperature 98.6, oxygen saturation 99%.   GENERAL APPEARANCE: Young female laying in bed in no acute distress.   HEAD: Remarkable for left forehead and periorbital swelling with maximum swelling on the medial side of left eyebrow. No pallor, no icterus, no cyanosis.   EARS, NOSE, AND THROAT: Hearing was normal. Nasal mucosa, lips, tongue were normal.   EYES: The left upper eyelid is slightly involved with the periorbital swelling. Normal conjunctivae. Pupils about 5 mm, equal and reactive to light.   NECK: Supple. Trachea at midline. No thyromegaly. No cervical lymphadenopathy.   HEART: Normal S1, S2. No S3, S4. No murmur. No gallop. No carotid bruits.   RESPIRATORY: Normal breathing pattern without use of accessory muscles. No rales. No wheezing.   ABDOMEN: Soft without tenderness. No hepatosplenomegaly. No masses. No hernias.   MUSCULOSKELETAL: No joint swelling. No  clubbing.   NEUROLOGIC: Cranial nerves II through XII are intact. No focal motor deficit.   PSYCHIATRY: The patient is alert and oriented x3. Mood and affect were normal.   LABORATORY,  DIAGNOSTIC, AND RADIOLOGICAL DATA: CBC showed white count of 13,700, hemoglobin 14, hematocrit 42, platelet count 242, serum sodium 140, potassium 3.6, BUN 11, creatinine 0.8, glucose 68. Liver function tests were normal.   ASSESSMENT: Left periorbital cellulitis with a prior history of MRSA infection.   PLAN:  1. The patient is admitted, received initially clindamycin in the Emergency Department. I changed that to vancomycin to be given as slow infusion.  2. Pain control. Tylenol p.r.n. Will monitor her response.  3. I would like to add deep vein thrombosis prophylaxis with Lovenox not only for general deep vein thrombosis prophylaxis but also to perhaps may prevent cavernous sinus thrombosis as well.    ____________________________ Carney CornersAmir M. Rudene Rearwish, MD amd:drc D: 03/19/2012 01:41:47 ET T: 03/19/2012 07:21:03 ET JOB#: [id number]  cc: Carney CornersAmir M. Rudene Rearwish, MD, <Dictator> Karolee OhsAMIR Dala DockM Siana Panameno MD ELECTRONICALLY SIGNED 03/19/2012 22:13
